# Patient Record
Sex: Female | Born: 1935 | Race: White | Hispanic: No | Marital: Married | Smoking: Never smoker
Health system: Southern US, Community
[De-identification: ages and names within clinical notes are randomized; demographics above are authoritative.]

## PROBLEM LIST (undated history)

## (undated) DIAGNOSIS — G309 Alzheimer's disease, unspecified: Secondary | ICD-10-CM

## (undated) DIAGNOSIS — F419 Anxiety disorder, unspecified: Secondary | ICD-10-CM

## (undated) DIAGNOSIS — R42 Dizziness and giddiness: Secondary | ICD-10-CM

## (undated) DIAGNOSIS — F0280 Dementia in other diseases classified elsewhere without behavioral disturbance: Secondary | ICD-10-CM

## (undated) DIAGNOSIS — R000 Tachycardia, unspecified: Secondary | ICD-10-CM

---

## 2005-05-11 ENCOUNTER — Encounter: Admission: RE | Admit: 2005-05-11 | Discharge: 2005-05-11 | Payer: Self-pay | Admitting: Geriatric Medicine

## 2006-12-18 ENCOUNTER — Other Ambulatory Visit: Admission: RE | Admit: 2006-12-18 | Discharge: 2006-12-18 | Payer: Self-pay | Admitting: Geriatric Medicine

## 2016-05-30 ENCOUNTER — Other Ambulatory Visit: Payer: Self-pay | Admitting: Geriatric Medicine

## 2016-05-30 DIAGNOSIS — R4701 Aphasia: Secondary | ICD-10-CM

## 2016-06-06 ENCOUNTER — Ambulatory Visit
Admission: RE | Admit: 2016-06-06 | Discharge: 2016-06-06 | Disposition: A | Discharge status: Home / Self Care | Payer: Medicare Other | Source: Ambulatory Visit | Attending: Geriatric Medicine | Admitting: Geriatric Medicine

## 2016-06-06 DIAGNOSIS — R4701 Aphasia: Secondary | ICD-10-CM

## 2017-04-10 ENCOUNTER — Emergency Department (HOSPITAL_COMMUNITY)
Admission: EM | Admit: 2017-04-10 | Discharge: 2017-04-10 | Disposition: A | Discharge status: Home / Self Care | Payer: Medicare Other | Attending: Physician Assistant | Admitting: Physician Assistant

## 2017-04-10 ENCOUNTER — Encounter (HOSPITAL_COMMUNITY): Payer: Self-pay | Admitting: *Deleted

## 2017-04-10 DIAGNOSIS — F0151 Vascular dementia with behavioral disturbance: Secondary | ICD-10-CM | POA: Diagnosis not present

## 2017-04-10 DIAGNOSIS — R4182 Altered mental status, unspecified: Secondary | ICD-10-CM | POA: Diagnosis present

## 2017-04-10 DIAGNOSIS — F01518 Vascular dementia, unspecified severity, with other behavioral disturbance: Secondary | ICD-10-CM

## 2017-04-10 HISTORY — DX: Dizziness and giddiness: R42

## 2017-04-10 HISTORY — DX: Alzheimer's disease, unspecified: G30.9

## 2017-04-10 HISTORY — DX: Dementia in other diseases classified elsewhere, unspecified severity, without behavioral disturbance, psychotic disturbance, mood disturbance, and anxiety: F02.80

## 2017-04-10 LAB — CBC WITH DIFFERENTIAL/PLATELET
BASOS PCT: 0 %
Basophils Absolute: 0 10*3/uL (ref 0.0–0.1)
EOS ABS: 0 10*3/uL (ref 0.0–0.7)
Eosinophils Relative: 0 %
HEMATOCRIT: 41.6 % (ref 36.0–46.0)
HEMOGLOBIN: 14.1 g/dL (ref 12.0–15.0)
LYMPHS ABS: 1.4 10*3/uL (ref 0.7–4.0)
Lymphocytes Relative: 27 %
MCH: 32.3 pg (ref 26.0–34.0)
MCHC: 33.9 g/dL (ref 30.0–36.0)
MCV: 95.4 fL (ref 78.0–100.0)
Monocytes Absolute: 0.3 10*3/uL (ref 0.1–1.0)
Monocytes Relative: 5 %
NEUTROS ABS: 3.4 10*3/uL (ref 1.7–7.7)
NEUTROS PCT: 68 %
Platelets: 311 10*3/uL (ref 150–400)
RBC: 4.36 MIL/uL (ref 3.87–5.11)
RDW: 13.5 % (ref 11.5–15.5)
WBC: 5 10*3/uL (ref 4.0–10.5)

## 2017-04-10 LAB — COMPREHENSIVE METABOLIC PANEL
ALT: 16 U/L (ref 14–54)
ANION GAP: 8 (ref 5–15)
AST: 24 U/L (ref 15–41)
Albumin: 4.3 g/dL (ref 3.5–5.0)
Alkaline Phosphatase: 35 U/L — ABNORMAL LOW (ref 38–126)
BUN: 14 mg/dL (ref 6–20)
CALCIUM: 9.1 mg/dL (ref 8.9–10.3)
CHLORIDE: 105 mmol/L (ref 101–111)
CO2: 27 mmol/L (ref 22–32)
CREATININE: 0.59 mg/dL (ref 0.44–1.00)
Glucose, Bld: 92 mg/dL (ref 65–99)
Potassium: 3.5 mmol/L (ref 3.5–5.1)
SODIUM: 140 mmol/L (ref 135–145)
Total Bilirubin: 0.8 mg/dL (ref 0.3–1.2)
Total Protein: 6.8 g/dL (ref 6.5–8.1)

## 2017-04-10 NOTE — ED Notes (Signed)
Pt not cooperative at triage for blood work, discussed with family and will wait until patient is in exam room for further orders.

## 2017-04-10 NOTE — ED Triage Notes (Signed)
Pt brought here by family. Reports hx of alzheimers that is getting progressively worse. Today, pt had episode of becoming paranoid and running away, stating that people are trying to kill her. Family was unable to calm her down and has brought pt here for further eval and guidance. Pt is anxious and paranoid at triage but is cooperating with staff.

## 2017-04-10 NOTE — ED Provider Notes (Signed)
[facility]-EMERGENCY DEPT Provider Note   CSN: [id number] Arrival date & time: 04/10/17  1200     History   Chief Complaint Chief Complaint  Patient presents with  . Altered Mental Status    HPI Sydney Welch is a 82 y.o. female.  HPI   82 year old female with hx of Alzheimer's disease brought here by family members (daughter and son in law) for Progressive worsening dementia. Per family member who is at bedside, for the past 3 months patient has had a steady decline of her mentation. She has episodes of confusion which become more paranoid and tried to run away. Today was the last episode that also daughter to be very concerned. Family did reach out to her primary care provider for her dementia and patient has been tried on 2 different types of medication with poor result. Therefore, patient has not been on any medication for her dementia recently. They did reach out to her PCP who recommend, to the ER for further evaluation. Family members also trying to set up either home health care or the nursing facility without any luck at this time. Family notes the patient has been eating less and sleeping less. No recent medication changes, perhaps urinate a bit less but nothing out of the ordinary. Patient has not been complaining of any pain.  Level V caveats for dementia    Past Medical History:  Diagnosis Date  . Alzheimer disease   . Vertigo     There are no active problems to display for this patient.   History reviewed. No pertinent surgical history.  OB History    No data available       Home Medications    Prior to Admission medications   Not on File    Family History History reviewed. No pertinent family history.  Social History Social History  Substance Use Topics  . Smoking status: Never Smoker  . Smokeless tobacco: Not on file  . Alcohol use No     Allergies   Caffeine   Review of Systems Review of Systems  Unable to perform ROS: Dementia      Physical Exam Updated Vital Signs BP (!) 145/100 (BP Location: Right Arm)   Pulse (!) 59   Temp 98 F (36.7 C) (Oral)   Resp (!) 22   SpO2 98%   Physical Exam  Constitutional: She appears well-developed and well-nourished. No distress.  Pleasant elderly female, laying in bed in no acute discomfort, nontoxic in appearance  HENT:  Head: Atraumatic.  Mouth/Throat: Oropharynx is clear and moist.  Eyes: Pupils are equal, round, and reactive to light. Conjunctivae and EOM are normal.  Neck: Neck supple.  No nuchal rigidity  Cardiovascular: Normal rate and regular rhythm.   Pulmonary/Chest: Effort normal and breath sounds normal.  Abdominal: Soft. Bowel sounds are normal. She exhibits no distension. There is no tenderness.  Neurological: She is alert. No cranial nerve deficit or sensory deficit. GCS eye subscore is 4. GCS verbal subscore is 5. GCS motor subscore is 6.  Alert to self only.  Skin: No rash noted.  Psychiatric: She has a normal mood and affect.  Pleasant, and alert to self only.  Nursing note and vitals reviewed.    ED Treatments / Results  Labs (all labs ordered are listed, but only abnormal results are displayed) Labs Reviewed  COMPREHENSIVE METABOLIC PANEL - Abnormal; Notable for the following:       Result Value   Alkaline Phosphatase 35 (*)  All other components within normal limits  CBC WITH DIFFERENTIAL/PLATELET    EKG  EKG Interpretation None       Radiology No results found.  Procedures Procedures (including critical care time)  Medications Ordered in ED Medications - No data to display   Initial Impression / Assessment and Plan / ED Course  I have reviewed the triage vital signs and the nursing notes.  Pertinent labs & imaging results that were available during my care of the patient were reviewed by me and considered in my medical decision making (see chart for details).     BP (!) 145/100 (BP Location: Right Arm)   Pulse  (!) 59   Temp 98 F (36.7 C) (Oral)   Resp (!) 22   SpO2 98%    Final Clinical Impressions(s) / ED Diagnoses   Final diagnoses:  Vascular dementia with behavior disturbance    New Prescriptions New Prescriptions   No medications on file   4:06 PM Patient with progressive worsening dementia not on a medication.  It appears that her symptoms progressive nature without any inciting factors. Plan to screen with basic labs, and check UA. Will discuss with social worker and case management to see if this additional social support  6:21 PM Appreciate consultation from Case Manager who have discussed with family members and offer outpatient support.  Pt will need face-to-face homehealth needs due to increase confusion and fall risk.    7:05 PM Labs are reassuring.  Family members appreciate Case Management help.  They feel comfortable with the plan.  Pt to be discharge.  Care discussed with Dr. Corlis LeakMacKuen who agrees with plan.    Fayrene Helperran, Amandajo Gonder, PA-C 04/10/17 1906    Abelino DerrickMackuen, Courteney Lyn, MD 04/10/17 [phone]

## 2017-04-10 NOTE — Care Management Note (Signed)
Case Management Note  Patient Details  Name: Sydney Welch MRN: [id number] Date of Birth: 1935/05/12  Subjective/Objective:        Patient presented to [facility] ED with   AMS high risk for falls.          Action/Plan: ED CM consulted concerning HH recommendations. Family has reported patient is not taking medications appropriate and an  Unsteady gait. Patient may benefit from [facility] services. CM met with patient and family at bedside to discuss recommendations for [facility] services at bedside. Patient and family are agreeable. Offered choice [facility] selected CM verified contact information. Referral faxed via [facility], no further questions or concerns at this time. Explained 24-48 hours nurse will contact patient and family by phone, patient and family verbalized understanding.       Expected Discharge Date:   04/10/17               Expected Discharge Plan:  [place]  In-House Referral:     Discharge planning Services  CM Consult  Post Acute Care Choice:    Choice offered to:  Patient, Adult Children  DME Arranged:    DME Agency:     HH Arranged:  RN, PT, OT, Nurse's Aide, Social Work [employer] Agency:  [facility]  Status of Service:  Completed, signed off  If discussed at [employer], dates discussed:    Additional CommentsLaurena Slimmer, RN 04/10/2017, 10:48 PM

## 2017-06-16 ENCOUNTER — Emergency Department (HOSPITAL_COMMUNITY): Payer: Medicare Other

## 2017-06-16 ENCOUNTER — Inpatient Hospital Stay (HOSPITAL_COMMUNITY): Payer: Medicare Other

## 2017-06-16 ENCOUNTER — Encounter (HOSPITAL_COMMUNITY): Payer: Self-pay

## 2017-06-16 ENCOUNTER — Inpatient Hospital Stay (HOSPITAL_COMMUNITY)
Admission: EM | Admit: 2017-06-16 | Discharge: 2017-06-22 | DRG: 470 | Disposition: A | Discharge status: Skilled Nursing Facility | Payer: Medicare Other | Attending: Internal Medicine | Admitting: Internal Medicine

## 2017-06-16 DIAGNOSIS — Z515 Encounter for palliative care: Secondary | ICD-10-CM

## 2017-06-16 DIAGNOSIS — F419 Anxiety disorder, unspecified: Secondary | ICD-10-CM | POA: Diagnosis present

## 2017-06-16 DIAGNOSIS — R10817 Generalized abdominal tenderness: Secondary | ICD-10-CM | POA: Diagnosis not present

## 2017-06-16 DIAGNOSIS — K5900 Constipation, unspecified: Secondary | ICD-10-CM | POA: Diagnosis present

## 2017-06-16 DIAGNOSIS — R509 Fever, unspecified: Secondary | ICD-10-CM | POA: Diagnosis not present

## 2017-06-16 DIAGNOSIS — R1310 Dysphagia, unspecified: Secondary | ICD-10-CM | POA: Diagnosis not present

## 2017-06-16 DIAGNOSIS — Z66 Do not resuscitate: Secondary | ICD-10-CM | POA: Diagnosis not present

## 2017-06-16 DIAGNOSIS — F0281 Dementia in other diseases classified elsewhere with behavioral disturbance: Secondary | ICD-10-CM | POA: Diagnosis present

## 2017-06-16 DIAGNOSIS — M8580 Other specified disorders of bone density and structure, unspecified site: Secondary | ICD-10-CM | POA: Diagnosis present

## 2017-06-16 DIAGNOSIS — S72011A Unspecified intracapsular fracture of right femur, initial encounter for closed fracture: Secondary | ICD-10-CM | POA: Diagnosis present

## 2017-06-16 DIAGNOSIS — G309 Alzheimer's disease, unspecified: Secondary | ICD-10-CM | POA: Diagnosis present

## 2017-06-16 DIAGNOSIS — S72009A Fracture of unspecified part of neck of unspecified femur, initial encounter for closed fracture: Secondary | ICD-10-CM | POA: Diagnosis present

## 2017-06-16 DIAGNOSIS — Z7189 Other specified counseling: Secondary | ICD-10-CM

## 2017-06-16 DIAGNOSIS — F03918 Unspecified dementia, unspecified severity, with other behavioral disturbance: Secondary | ICD-10-CM | POA: Diagnosis present

## 2017-06-16 DIAGNOSIS — W19XXXA Unspecified fall, initial encounter: Secondary | ICD-10-CM | POA: Diagnosis present

## 2017-06-16 DIAGNOSIS — F0391 Unspecified dementia with behavioral disturbance: Secondary | ICD-10-CM | POA: Diagnosis present

## 2017-06-16 DIAGNOSIS — R52 Pain, unspecified: Secondary | ICD-10-CM

## 2017-06-16 DIAGNOSIS — R319 Hematuria, unspecified: Secondary | ICD-10-CM | POA: Diagnosis present

## 2017-06-16 DIAGNOSIS — E876 Hypokalemia: Secondary | ICD-10-CM | POA: Diagnosis not present

## 2017-06-16 DIAGNOSIS — D649 Anemia, unspecified: Secondary | ICD-10-CM | POA: Diagnosis present

## 2017-06-16 DIAGNOSIS — Z888 Allergy status to other drugs, medicaments and biological substances status: Secondary | ICD-10-CM

## 2017-06-16 DIAGNOSIS — E860 Dehydration: Secondary | ICD-10-CM | POA: Diagnosis present

## 2017-06-16 DIAGNOSIS — R000 Tachycardia, unspecified: Secondary | ICD-10-CM | POA: Diagnosis present

## 2017-06-16 DIAGNOSIS — S72001A Fracture of unspecified part of neck of right femur, initial encounter for closed fracture: Secondary | ICD-10-CM

## 2017-06-16 DIAGNOSIS — R109 Unspecified abdominal pain: Secondary | ICD-10-CM

## 2017-06-16 HISTORY — DX: Tachycardia, unspecified: R00.0

## 2017-06-16 HISTORY — DX: Anxiety disorder, unspecified: F41.9

## 2017-06-16 LAB — URINALYSIS, ROUTINE W REFLEX MICROSCOPIC
Bacteria, UA: NONE SEEN
Glucose, UA: NEGATIVE mg/dL
Hgb urine dipstick: NEGATIVE
KETONES UR: 20 mg/dL — AB
Leukocytes, UA: NEGATIVE
NITRITE: NEGATIVE
PH: 5 (ref 5.0–8.0)
Protein, ur: 30 mg/dL — AB
SPECIFIC GRAVITY, URINE: 1.03 (ref 1.005–1.030)
WBC, UA: NONE SEEN WBC/hpf (ref 0–5)

## 2017-06-16 LAB — CBC WITH DIFFERENTIAL/PLATELET
BASOS PCT: 0 %
Basophils Absolute: 0 10*3/uL (ref 0.0–0.1)
Eosinophils Absolute: 0 10*3/uL (ref 0.0–0.7)
Eosinophils Relative: 0 %
HEMATOCRIT: 48.1 % — AB (ref 36.0–46.0)
Hemoglobin: 16.3 g/dL — ABNORMAL HIGH (ref 12.0–15.0)
LYMPHS PCT: 20 %
Lymphs Abs: 1.3 10*3/uL (ref 0.7–4.0)
MCH: 31.5 pg (ref 26.0–34.0)
MCHC: 33.9 g/dL (ref 30.0–36.0)
MCV: 93 fL (ref 78.0–100.0)
MONO ABS: 0.6 10*3/uL (ref 0.1–1.0)
MONOS PCT: 9 %
NEUTROS ABS: 4.8 10*3/uL (ref 1.7–7.7)
Neutrophils Relative %: 71 %
Platelets: 400 10*3/uL (ref 150–400)
RBC: 5.17 MIL/uL — ABNORMAL HIGH (ref 3.87–5.11)
RDW: 12.8 % (ref 11.5–15.5)
WBC: 6.7 10*3/uL (ref 4.0–10.5)

## 2017-06-16 LAB — COMPREHENSIVE METABOLIC PANEL
ALBUMIN: 4.4 g/dL (ref 3.5–5.0)
ALK PHOS: 63 U/L (ref 38–126)
ALT: 16 U/L (ref 14–54)
ANION GAP: 13 (ref 5–15)
AST: 27 U/L (ref 15–41)
BUN: 14 mg/dL (ref 6–20)
CALCIUM: 9.7 mg/dL (ref 8.9–10.3)
CO2: 26 mmol/L (ref 22–32)
Chloride: 101 mmol/L (ref 101–111)
Creatinine, Ser: 0.56 mg/dL (ref 0.44–1.00)
GFR calc Af Amer: >60 mL/min (ref >60)
GFR calc non Af Amer: >60 mL/min (ref >60)
GLUCOSE: 91 mg/dL (ref 65–99)
Potassium: 4.1 mmol/L (ref 3.5–5.1)
SODIUM: 140 mmol/L (ref 135–145)
Total Bilirubin: 1.1 mg/dL (ref 0.3–1.2)
Total Protein: 7.6 g/dL (ref 6.5–8.1)

## 2017-06-16 LAB — TROPONIN I: Troponin I: <0.03 ng/mL (ref <0.03)

## 2017-06-16 LAB — I-STAT CG4 LACTIC ACID, ED: Lactic Acid, Venous: 1.01 mmol/L (ref 0.5–1.9)

## 2017-06-16 MED ORDER — VERAPAMIL HCL ER 240 MG PO TBCR
240.0000 mg | EXTENDED_RELEASE_TABLET | Freq: Every day | ORAL | Status: DC
  Administered 2017-06-17: 240 mg via ORAL
  Filled 2017-06-16 (×7): qty 1

## 2017-06-16 MED ORDER — SORBITOL 70 % SOLN
960.0000 mL | TOPICAL_OIL | Freq: Once | ORAL | Status: AC
  Administered 2017-06-16: 960 mL via RECTAL
  Filled 2017-06-16: qty 240

## 2017-06-16 MED ORDER — METHOCARBAMOL 500 MG PO TABS
500.0000 mg | ORAL_TABLET | Freq: Four times a day (QID) | ORAL | Status: DC | PRN
Start: 2017-06-16 — End: 2017-06-22
  Administered 2017-06-19: 500 mg via ORAL
  Filled 2017-06-16: qty 1

## 2017-06-16 MED ORDER — SODIUM CHLORIDE 0.9 % IV BOLUS (SEPSIS)
1000.0000 mL | Freq: Once | INTRAVENOUS | Status: AC
  Administered 2017-06-16: 1000 mL via INTRAVENOUS

## 2017-06-16 MED ORDER — HYDROCODONE-ACETAMINOPHEN 5-325 MG PO TABS
1.0000 | ORAL_TABLET | Freq: Four times a day (QID) | ORAL | Status: DC | PRN
  Administered 2017-06-17: 2 via ORAL
  Filled 2017-06-16: qty 2

## 2017-06-16 MED ORDER — LORAZEPAM 2 MG/ML IJ SOLN
1.0000 mg | Freq: Four times a day (QID) | INTRAMUSCULAR | Status: DC | PRN
  Administered 2017-06-17 – 2017-06-22 (×10): 1 mg via INTRAVENOUS
  Filled 2017-06-16 (×10): qty 1

## 2017-06-16 MED ORDER — LORAZEPAM 2 MG/ML IJ SOLN
0.5000 mg | Freq: Once | INTRAMUSCULAR | Status: AC
  Administered 2017-06-16: 0.5 mg via INTRAVENOUS
  Filled 2017-06-16: qty 1

## 2017-06-16 MED ORDER — METHOCARBAMOL 1000 MG/10ML IJ SOLN
500.0000 mg | Freq: Four times a day (QID) | INTRAVENOUS | Status: DC | PRN
  Filled 2017-06-16: qty 5

## 2017-06-16 MED ORDER — LACTATED RINGERS IV SOLN
INTRAVENOUS | Status: DC
  Administered 2017-06-17 (×2): via INTRAVENOUS

## 2017-06-16 MED ORDER — SODIUM CHLORIDE 0.9 % IV BOLUS (SEPSIS)
500.0000 mL | Freq: Once | INTRAVENOUS | Status: AC
  Administered 2017-06-16: 500 mL via INTRAVENOUS

## 2017-06-16 MED ORDER — MORPHINE SULFATE (PF) 2 MG/ML IV SOLN: 0.5000 mg | INTRAVENOUS | Status: DC | PRN

## 2017-06-16 NOTE — ED Provider Notes (Signed)
[facility]-EMERGENCY DEPT Provider Note   CSN: [id number] Arrival date & time: 06/16/17  1605     History   Chief Complaint Chief Complaint  Patient presents with  . Weakness    HPI Sydney Welch is a 82 y.o. female.  Pt has a hx of alzheimer's disease and is cared for by her daughter.  The daughter said pt has been sleeping more than normal and has not been acting her normal self.  Daughter said she has not been eating.  Drinking little.  Decreased uop.  No bm in about 2 weeks.  Pt unable to given any hx.  Pt c/o some right and mid lower abdominal pain.      Past Medical History:  Diagnosis Date  . Alzheimer disease   . Vertigo     There are no active problems to display for this patient.   No past surgical history on file.  OB History    No data available       Home Medications    Prior to Admission medications   Medication Sig Start Date End Date Taking? Authorizing Provider  ENSURE (ENSURE) Take 237 mLs by mouth daily.   Yes [provider]  LORazepam (ATIVAN) 1 MG tablet Take 1 mg by mouth 2 (two) times daily. 06/02/17  Yes [provider]  verapamil (CALAN-SR) 240 MG CR tablet Take 240 mg by mouth daily. 03/26/17  Yes [provider]    Family History No family history on file.  Social History Social History  Substance Use Topics  . Smoking status: Never Smoker  . Smokeless tobacco: Not on file  . Alcohol use No     Allergies   Caffeine   Review of Systems Review of Systems  Unable to perform ROS: Dementia     Physical Exam Updated Vital Signs BP 130/84   Pulse 85   Temp 97.9 F (36.6 C) (Oral)   Resp 17   SpO2 100%   Physical Exam  Constitutional: She appears well-developed and well-nourished.  HENT:  Head: Normocephalic and atraumatic.  Right Ear: External ear normal.  Left Ear: External ear normal.  Nose: Nose normal.  Mouth/Throat: Mucous membranes are dry.  Eyes: Pupils are equal, round, and  reactive to light. Conjunctivae and EOM are normal.  Neck: Normal range of motion. Neck supple.  Cardiovascular: Regular rhythm, normal heart sounds and intact distal pulses.  Tachycardia present.   Pulmonary/Chest: Effort normal and breath sounds normal.  Abdominal: Soft. Bowel sounds are normal.  Musculoskeletal: Normal range of motion.  Neurological: She is alert.  Skin: Skin is warm and dry.  Nursing note and vitals reviewed.    ED Treatments / Results  Labs (all labs ordered are listed, but only abnormal results are displayed) Labs Reviewed  URINALYSIS, ROUTINE W REFLEX MICROSCOPIC - Abnormal; Notable for the following:       Result Value   Color, Urine AMBER (*)    APPearance CLOUDY (*)    Bilirubin Urine SMALL (*)    Ketones, ur 20 (*)    Protein, ur 30 (*)    Squamous Epithelial / LPF 0-5 (*)    All other components within normal limits  CBC WITH DIFFERENTIAL/PLATELET - Abnormal; Notable for the following:    RBC 5.17 (*)    Hemoglobin 16.3 (*)    HCT 48.1 (*)    All other components within normal limits  CULTURE, BLOOD (ROUTINE X 2)  CULTURE, BLOOD (ROUTINE X 2)  COMPREHENSIVE  METABOLIC PANEL  TROPONIN I  I-STAT CG4 LACTIC ACID, ED  I-STAT CG4 LACTIC ACID, ED    EKG  EKG Interpretation  Date/Time:  Sunday June 16 2017 16:29:21 EDT Ventricular Rate:  103 PR Interval:    QRS Duration: 93 QT Interval:  336 QTC Calculation: 440 R Axis:   70 Text Interpretation:  Sinus tachycardia Right atrial enlargement Consider anterior infarct Artifact in lead(s) I II aVR aVL Confirmed by Jacalyn Lefevre [phone]) on 06/16/2017 9:20:58 PM       Radiology Dg Chest 2 View  Result Date: 06/16/2017 CLINICAL DATA:  Mental status change. EXAM: CHEST  2 VIEW COMPARISON:  None. FINDINGS: The heart size is normal. The lungs are hyperinflated but clear. No pleural effusion or edema. Thoracic scoliosis identified. IMPRESSION: 1. No acute cardiopulmonary abnormalities. 2.  Scoliosis. Electronically Signed   By: Signa Kell M.D.   On: 06/16/2017 17:57   Dg Abd 2 Views  Result Date: 06/16/2017 CLINICAL DATA:  Abdominal pain. EXAM: ABDOMEN - 2 VIEW COMPARISON:  None. FINDINGS: The bowel gas pattern is normal. Moderate stool burden identified within the colon. There is no evidence of free air. No radio-opaque calculi or other significant radiographic abnormality is seen. IMPRESSION: Nonobstructive bowel gas pattern. Moderate stool burden within the colon. Electronically Signed   By: Signa Kell M.D.   On: 06/16/2017 18:00   Ct Renal Stone Study  Result Date: 06/16/2017 CLINICAL DATA:  Altered mental status with possible UTI. EXAM: CT ABDOMEN AND PELVIS WITHOUT CONTRAST TECHNIQUE: Multidetector CT imaging of the abdomen and pelvis was performed following the standard protocol without IV contrast. COMPARISON:  None. FINDINGS: Lower chest: Minimal dependent atelectasis over the right base. Hepatobiliary: The liver and biliary tree are within normal. Possible sludge versus small stones within the dependent portion of the gallbladder. Pancreas: Pancreas somewhat difficult to visualize on this noncontrast study although no definite abnormality. Spleen: Within normal. Adrenals/Urinary Tract: Adrenal glands are normal. Kidneys are normal in size without hydronephrosis or nephrolithiasis. 1.1 cm cyst over the mid to upper pole left kidney with subcentimeter hyperdensity over the corticomedullary junction of the mid to upper pole left kidney with Hounsfield unit measurements of 99 likely early calcification. Ureters are unremarkable. Tiny focus of air within the nondependent portion of the bladder compatible with known recent catheterization. Stomach/Bowel: The stomach and small bowel are within normal. Appendix is normal. There is diverticulosis of the colon with mild fecal retention throughout the colon. Vascular/Lymphatic: Minimal calcified plaque over the abdominal aorta. No  significant adenopathy. Reproductive: Within normal. Other: No significant free fluid or focal inflammatory change. No free peritoneal air. Musculoskeletal: Moderate degenerate changes spine with curvature of the thoracolumbar spine convex left. Mild compression fracture of L4 age indeterminate. Mild degenerative change of the hips. Subcapital fracture of the right femoral neck likely subacute nature with minimal displacement. IMPRESSION: No acute findings in the abdomen/pelvis. Minimally displaced subcapital fracture of the right femoral neck likely subacute nature. Suggestion of mild gallbladder sludge versus stones. 1.1 cm left renal cyst with suggestion mild left focal nephrocalcinosis. Diverticulosis of the colon. Mild L4 compression fracture likely chronic. These results were called by telephone at the time of interpretation on 06/16/2017 at 8:33 pm to Dr. Jacalyn Lefevre , who verbally acknowledged these results. Electronically Signed   By: Elberta Fortis M.D.   On: 06/16/2017 20:33    Procedures Procedures (including critical care time)  Medications Ordered in ED Medications  sodium chloride 0.9 % bolus 1,000  mL (0 mLs Intravenous Stopped 06/16/17 1842)    And  sodium chloride 0.9 % bolus 500 mL (0 mLs Intravenous Stopped 06/16/17 2054)  sorbitol, milk of mag, mineral oil, glycerin (SMOG) enema (960 mLs Rectal Given 06/16/17 2016)  LORazepam (ATIVAN) injection 0.5 mg (0.5 mg Intravenous Given 06/16/17 1848)  sodium chloride 0.9 % bolus 1,000 mL (0 mLs Intravenous Stopped 06/16/17 2015)     Initial Impression / Assessment and Plan / ED Course  I have reviewed the triage vital signs and the nursing notes.  Pertinent labs & imaging results that were available during my care of the patient were reviewed by me and considered in my medical decision making (see chart for details).    I did the CT b/c of the hematuria and possibility of kidney stone.  It did not show that, but she does have a hip  fracture.  The pt's family said she fell about 1 month ago.  They took her to her doctor's and she was able to walk with assistance, so no xray was done.  Daughters said she is no longer able to walk.  They thought it was due to an infection or some other cause.  Pt d/w Dr. Charlann Boxer (ortho) who will see her tomorrow here at [facility].  Pt also d/w Dr. Ophelia Charter (triad) for admission.  Final Clinical Impressions(s) / ED Diagnoses   Final diagnoses:  Pain  Closed fracture of neck of right femur, initial encounter (HCC)  Dehydration  Hematuria, unspecified type  Constipation, unspecified constipation type    New Prescriptions New Prescriptions   No medications on file     Jacalyn Lefevre, MD 06/16/17 2143

## 2017-06-16 NOTE — ED Triage Notes (Signed)
Her daughter states that for past few days pt. Is eating less and is becoming "sleepy a lot". Pt. Arrives here alert and confused, which is her baseline.

## 2017-06-16 NOTE — ED Notes (Signed)
Bed: ZO10 Expected date: 06/16/17 Expected time: 3:54 PM Means of arrival: Ambulance Comments: AMS ? UTI

## 2017-06-16 NOTE — H&P (Signed)
History and Physical    Sydney Welch ZOX:[id number] DOB: Apr 08, 1935 DOA: 06/16/2017  PCP: Merlene Laughter, MD Consultants:  None Patient coming from: Home - lives with daughters, son-in-law, grandson; [place]: daughter, [phone]  Chief Complaint: excessive somnolence  HPI: Sydney Welch is a 82 y.o. female with medical history significant of tachycardia and Alzheimer's with behavioral disturbance presenting with excessive somnolence. She fell about 4 times in the last month.  Today, she was sleeping 18 hours and it was hard to get her awake.  She has been sleeping more than usual over the last 3-4 days.  She has 2 skin cancers and she was supposed to go for consultation tomorrow to have them removed.  She has been having pain - moaning and groaning with being moved.  Over the last week or two she hasn't been able to walk at all - which they thought was due to progressive dementia.  Dr. Pete Glatter evaluated her about 2 1/2 weeks ago and watched her walk and wasn't concerned at that time.  Daughter thinks the fracture may have happened a month ago but she has been able to walk since then.  She has not been able to swallow her Ativan and Verapamil the last couple of days.   ED Course: Found to have hematuria, concern for kidney stone so CT done and showed right hip fracture.  Dr. Charlann Boxer to see tomorrow and will plan to do ORIF.  Review of Systems: Unable to perform  Ambulatory Status:  Ambulated without assistance prior to the last 2 weeks  Past Medical History:  Diagnosis Date  . Alzheimer disease   . Anxiety   . Tachycardia   . Vertigo     History reviewed. No pertinent surgical history.  Social History   Social History  . Marital status: Married    Spouse name: N/A  . Number of children: N/A  . Years of education: N/A   Occupational History  . retired    Social History Main Topics  . Smoking status: Never Smoker  . Smokeless tobacco: Never Used  . Alcohol use No  .  Drug use: No  . Sexual activity: Not on file   Other Topics Concern  . Not on file   Social History Narrative  . No narrative on file    Allergies  Allergen Reactions  . Caffeine     History reviewed. No pertinent family history.  Prior to Admission medications   Medication Sig Start Date End Date Taking? Authorizing Provider  ENSURE (ENSURE) Take 237 mLs by mouth daily.   Yes [provider]  LORazepam (ATIVAN) 1 MG tablet Take 1 mg by mouth 2 (two) times daily. 06/02/17  Yes [provider]  verapamil (CALAN-SR) 240 MG CR tablet Take 240 mg by mouth daily. 03/26/17  Yes [provider]    Physical Exam: Vitals:   06/16/17 1619 06/16/17 1900 06/16/17 2018 06/16/17 2030  BP: 91/79 121/69 130/84 (!) 146/98  Pulse: (!) 104 92 85   Resp: 18  17   Temp: 97.9 F (36.6 C)     TempSrc: Oral     SpO2: 100% 99% 100%      General:  Appears older than stated age, mumbles nonsensically frequently, agitated and wants to hold hands constantly Eyes:  PERRL, EOMI, normal lids, iris ENT: normal lips & tongue Neck:  no LAD, masses or thyromegaly; no carotid bruits Cardiovascular:  RRR, no m/r/g. No LE edema.  Respiratory:   CTA bilaterally  with no wheezes/rales/rhonchi.  Normal respiratory effort. Abdomen:  soft, NT, ND, NABS Skin:  no rash or induration seen on limited exam Musculoskeletal:  grossly normal tone BUE/BLE, good ROM, no bony abnormality Psychiatric: agitated, frequent nonsensical speech, AOx1 Neurologic: unable to assess    Radiological Exams on Admission: Dg Chest 2 View  Result Date: 06/16/2017 CLINICAL DATA:  Mental status change. EXAM: CHEST  2 VIEW COMPARISON:  None. FINDINGS: The heart size is normal. The lungs are hyperinflated but clear. No pleural effusion or edema. Thoracic scoliosis identified. IMPRESSION: 1. No acute cardiopulmonary abnormalities. 2. Scoliosis. Electronically Signed   By: Signa Kell M.D.   On: 06/16/2017 17:57    Dg Abd 2 Views  Result Date: 06/16/2017 CLINICAL DATA:  Abdominal pain. EXAM: ABDOMEN - 2 VIEW COMPARISON:  None. FINDINGS: The bowel gas pattern is normal. Moderate stool burden identified within the colon. There is no evidence of free air. No radio-opaque calculi or other significant radiographic abnormality is seen. IMPRESSION: Nonobstructive bowel gas pattern. Moderate stool burden within the colon. Electronically Signed   By: Signa Kell M.D.   On: 06/16/2017 18:00   Ct Renal Stone Study  Result Date: 06/16/2017 CLINICAL DATA:  Altered mental status with possible UTI. EXAM: CT ABDOMEN AND PELVIS WITHOUT CONTRAST TECHNIQUE: Multidetector CT imaging of the abdomen and pelvis was performed following the standard protocol without IV contrast. COMPARISON:  None. FINDINGS: Lower chest: Minimal dependent atelectasis over the right base. Hepatobiliary: The liver and biliary tree are within normal. Possible sludge versus small stones within the dependent portion of the gallbladder. Pancreas: Pancreas somewhat difficult to visualize on this noncontrast study although no definite abnormality. Spleen: Within normal. Adrenals/Urinary Tract: Adrenal glands are normal. Kidneys are normal in size without hydronephrosis or nephrolithiasis. 1.1 cm cyst over the mid to upper pole left kidney with subcentimeter hyperdensity over the corticomedullary junction of the mid to upper pole left kidney with Hounsfield unit measurements of 99 likely early calcification. Ureters are unremarkable. Tiny focus of air within the nondependent portion of the bladder compatible with known recent catheterization. Stomach/Bowel: The stomach and small bowel are within normal. Appendix is normal. There is diverticulosis of the colon with mild fecal retention throughout the colon. Vascular/Lymphatic: Minimal calcified plaque over the abdominal aorta. No significant adenopathy. Reproductive: Within normal. Other: No significant free fluid  or focal inflammatory change. No free peritoneal air. Musculoskeletal: Moderate degenerate changes spine with curvature of the thoracolumbar spine convex left. Mild compression fracture of L4 age indeterminate. Mild degenerative change of the hips. Subcapital fracture of the right femoral neck likely subacute nature with minimal displacement. IMPRESSION: No acute findings in the abdomen/pelvis. Minimally displaced subcapital fracture of the right femoral neck likely subacute nature. Suggestion of mild gallbladder sludge versus stones. 1.1 cm left renal cyst with suggestion mild left focal nephrocalcinosis. Diverticulosis of the colon. Mild L4 compression fracture likely chronic. These results were called by telephone at the time of interpretation on 06/16/2017 at 8:33 pm to Dr. Jacalyn Lefevre , who verbally acknowledged these results. Electronically Signed   By: Elberta Fortis M.D.   On: 06/16/2017 20:33    EKG: Independently reviewed.  Sinus tachycardia with rate 103; nonspecific ST changes with no evidence of acute ischemia, +artifact   Labs on Admission: I have personally reviewed the available labs and imaging studies at the time of the admission.  Pertinent labs:   UA: 20 ketones, 30 protein, TNTC RBC, small bilirubin Blood cultures pending  Assessment/Plan  Principal Problem:   Hip fracture (HCC) Active Problems:   Dementia with behavioral disturbance   Tachycardia   Hip fracture -Mechanical falls resulting in hip fracture, uncertain timeframe of injury -Orthopedics consult in AM -NPO after midnight in anticipation of surgical repair tomorrow -SCDs overnight, start Lovenox post-operatively (or as per ortho) -CXR and EKG done in ER -Pain control with Vicodin, morphine -SW consult for rehab placement -Will need PT consult post-operatively -Hip fracture order set utilized  Dementia -She is not taking medications for dementia, may benefit from initiation of Aricept -Family counseled  about how patients with dementia often do better behaviorally while hospitalized if family members are present overnight -Will order Recruitment consultant -She does well with Ativan at home for her behavior disturbance; will try to use IV Ativan here but may need to transition to Haldol  Tachycardia -Continue nightly verapamil -May need apple sauce or another way to get way to take the medication as she has had some oral aversion this week  DVT prophylaxis: SCDs Code Status:  DNR - confirmed with family Family Communication: Daughters, son-in-law, another family member present throughout evaluation  Disposition Plan: SNF once clinically improved Consults called: Orthopedics; Nutrition, SW, PT, OT Admission status: Admit - It is my clinical opinion that admission to INPATIENT is reasonable and necessary because this patient will require at least 2 midnights in the hospital to treat this condition based on the medical complexity of the problems presented.  Given the aforementioned information, the predictability of an adverse outcome is felt to be significant.    Jonah Blue MD [facility]  If note is complete, please contact covering daytime or nighttime physician. [email or web address] Password [email or web address]  06/16/2017, 10:46 PM

## 2017-06-16 NOTE — ED Notes (Signed)
admitting Provider at bedside. 

## 2017-06-16 NOTE — ED Notes (Signed)
ED Provider at bedside to discuss with family about plan of care and disposition

## 2017-06-17 ENCOUNTER — Encounter (HOSPITAL_COMMUNITY): Payer: Self-pay | Admitting: Certified Registered Nurse Anesthetist

## 2017-06-17 ENCOUNTER — Inpatient Hospital Stay (HOSPITAL_COMMUNITY): Payer: Medicare Other

## 2017-06-17 ENCOUNTER — Encounter (HOSPITAL_COMMUNITY)
Admission: EM | Disposition: A | Discharge status: Skilled Nursing Facility | Payer: Self-pay | Source: Home / Self Care | Attending: Internal Medicine

## 2017-06-17 ENCOUNTER — Inpatient Hospital Stay (HOSPITAL_COMMUNITY): Payer: Medicare Other | Admitting: Anesthesiology

## 2017-06-17 DIAGNOSIS — S72009A Fracture of unspecified part of neck of unspecified femur, initial encounter for closed fracture: Secondary | ICD-10-CM

## 2017-06-17 HISTORY — PX: HIP ARTHROPLASTY: SHX981

## 2017-06-17 LAB — SURGICAL PCR SCREEN
MRSA, PCR: POSITIVE — AB
STAPHYLOCOCCUS AUREUS: POSITIVE — AB

## 2017-06-17 SURGERY — HEMIARTHROPLASTY, HIP, DIRECT ANTERIOR APPROACH, FOR FRACTURE
Anesthesia: Spinal | Laterality: Right

## 2017-06-17 MED ORDER — DOCUSATE SODIUM 100 MG PO CAPS
100.0000 mg | ORAL_CAPSULE | Freq: Two times a day (BID) | ORAL | Status: DC
  Administered 2017-06-19: 100 mg via ORAL
  Filled 2017-06-17 (×5): qty 1

## 2017-06-17 MED ORDER — MUPIROCIN 2 % EX OINT
1.0000 "application " | TOPICAL_OINTMENT | Freq: Two times a day (BID) | CUTANEOUS | Status: AC
  Administered 2017-06-18 – 2017-06-22 (×10): 1 via NASAL
  Filled 2017-06-17 (×2): qty 22

## 2017-06-17 MED ORDER — POLYETHYLENE GLYCOL 3350 17 G PO PACK: 17.0000 g | PACK | Freq: Every day | ORAL | Status: DC | PRN

## 2017-06-17 MED ORDER — PROPOFOL 10 MG/ML IV BOLUS
INTRAVENOUS | Status: AC
  Filled 2017-06-17: qty 20

## 2017-06-17 MED ORDER — EPHEDRINE SULFATE 50 MG/ML IJ SOLN
INTRAMUSCULAR | Status: DC | PRN
  Administered 2017-06-17 (×2): 10 mg via INTRAVENOUS
  Administered 2017-06-17 (×2): 15 mg via INTRAVENOUS

## 2017-06-17 MED ORDER — ALBUMIN HUMAN 5 % IV SOLN
INTRAVENOUS | Status: AC
  Filled 2017-06-17: qty 250

## 2017-06-17 MED ORDER — MENTHOL 3 MG MT LOZG: 1.0000 | LOZENGE | OROMUCOSAL | Status: DC | PRN

## 2017-06-17 MED ORDER — ENOXAPARIN SODIUM 40 MG/0.4ML ~~LOC~~ SOLN
40.0000 mg | SUBCUTANEOUS | Status: DC
  Administered 2017-06-18 – 2017-06-22 (×4): 40 mg via SUBCUTANEOUS
  Filled 2017-06-17 (×4): qty 0.4

## 2017-06-17 MED ORDER — HYDROCODONE-ACETAMINOPHEN 5-325 MG PO TABS
1.0000 | ORAL_TABLET | Freq: Four times a day (QID) | ORAL | Status: DC | PRN
  Administered 2017-06-19: 1 via ORAL
  Filled 2017-06-17: qty 1

## 2017-06-17 MED ORDER — ONDANSETRON HCL 4 MG/2ML IJ SOLN: 4.0000 mg | Freq: Once | INTRAMUSCULAR | Status: DC | PRN

## 2017-06-17 MED ORDER — CEFAZOLIN SODIUM-DEXTROSE 1-4 GM/50ML-% IV SOLN
1.0000 g | Freq: Four times a day (QID) | INTRAVENOUS | Status: AC
  Administered 2017-06-17 – 2017-06-18 (×2): 1 g via INTRAVENOUS
  Filled 2017-06-17 (×2): qty 50

## 2017-06-17 MED ORDER — FENTANYL CITRATE (PF) 100 MCG/2ML IJ SOLN
INTRAMUSCULAR | Status: AC
  Filled 2017-06-17: qty 2

## 2017-06-17 MED ORDER — PHENYLEPHRINE HCL 10 MG/ML IJ SOLN
INTRAMUSCULAR | Status: DC | PRN
  Administered 2017-06-17: 40 ug/min via INTRAVENOUS

## 2017-06-17 MED ORDER — FENTANYL CITRATE (PF) 100 MCG/2ML IJ SOLN: 25.0000 ug | INTRAMUSCULAR | Status: DC | PRN

## 2017-06-17 MED ORDER — ONDANSETRON HCL 4 MG/2ML IJ SOLN: 4.0000 mg | Freq: Four times a day (QID) | INTRAMUSCULAR | Status: DC | PRN

## 2017-06-17 MED ORDER — DEXAMETHASONE SODIUM PHOSPHATE 10 MG/ML IJ SOLN: 8.0000 mg | Freq: Once | INTRAMUSCULAR | Status: DC

## 2017-06-17 MED ORDER — CEFAZOLIN SODIUM-DEXTROSE 2-4 GM/100ML-% IV SOLN
2.0000 g | INTRAVENOUS | Status: AC
  Administered 2017-06-17: 2 g via INTRAVENOUS

## 2017-06-17 MED ORDER — ALBUMIN HUMAN 5 % IV SOLN
12.5000 g | Freq: Once | INTRAVENOUS | Status: AC
  Administered 2017-06-17: 12.5 g via INTRAVENOUS

## 2017-06-17 MED ORDER — PHENYLEPHRINE HCL 10 MG/ML IJ SOLN
INTRAMUSCULAR | Status: DC | PRN
  Administered 2017-06-17: 80 ug via INTRAVENOUS

## 2017-06-17 MED ORDER — SODIUM CHLORIDE 0.9 % IR SOLN
Status: DC | PRN
  Administered 2017-06-17: 1000 mL

## 2017-06-17 MED ORDER — PROPOFOL 500 MG/50ML IV EMUL
INTRAVENOUS | Status: DC | PRN
  Administered 2017-06-17: 25 ug/kg/min via INTRAVENOUS

## 2017-06-17 MED ORDER — ALUM & MAG HYDROXIDE-SIMETH 200-200-20 MG/5ML PO SUSP: 30.0000 mL | ORAL | Status: DC | PRN

## 2017-06-17 MED ORDER — MIDAZOLAM HCL 2 MG/2ML IJ SOLN
INTRAMUSCULAR | Status: AC
  Filled 2017-06-17: qty 2

## 2017-06-17 MED ORDER — POVIDONE-IODINE 10 % EX SWAB: 2.0000 "application " | Freq: Once | CUTANEOUS | Status: DC

## 2017-06-17 MED ORDER — MIDAZOLAM HCL 5 MG/5ML IJ SOLN
INTRAMUSCULAR | Status: DC | PRN
  Administered 2017-06-17 (×2): 1 mg via INTRAVENOUS

## 2017-06-17 MED ORDER — BUPIVACAINE IN DEXTROSE 0.75-8.25 % IT SOLN
INTRATHECAL | Status: DC | PRN
  Administered 2017-06-17: 10 mg via INTRATHECAL

## 2017-06-17 MED ORDER — SODIUM CHLORIDE 0.9 % IV SOLN: INTRAVENOUS | Status: DC

## 2017-06-17 MED ORDER — MORPHINE SULFATE (PF) 4 MG/ML IV SOLN
0.5000 mg | INTRAVENOUS | Status: DC | PRN
Start: 2017-06-17 — End: 2017-06-18
  Administered 2017-06-17 – 2017-06-18 (×2): 0.52 mg via INTRAVENOUS
  Filled 2017-06-17 (×2): qty 1

## 2017-06-17 MED ORDER — METOCLOPRAMIDE HCL 5 MG/ML IJ SOLN: 5.0000 mg | Freq: Three times a day (TID) | INTRAMUSCULAR | Status: DC | PRN

## 2017-06-17 MED ORDER — MORPHINE SULFATE (PF) 4 MG/ML IV SOLN: 0.5000 mg | INTRAVENOUS | Status: DC | PRN

## 2017-06-17 MED ORDER — PHENOL 1.4 % MT LIQD
1.0000 | OROMUCOSAL | Status: DC | PRN
  Filled 2017-06-17: qty 177

## 2017-06-17 MED ORDER — FENTANYL CITRATE (PF) 100 MCG/2ML IJ SOLN
INTRAMUSCULAR | Status: DC | PRN
  Administered 2017-06-17 (×2): 50 ug via INTRAVENOUS

## 2017-06-17 MED ORDER — CHLORHEXIDINE GLUCONATE 4 % EX LIQD: 60.0000 mL | Freq: Once | CUTANEOUS | Status: DC

## 2017-06-17 MED ORDER — ORAL CARE MOUTH RINSE
15.0000 mL | Freq: Two times a day (BID) | OROMUCOSAL | Status: DC
  Administered 2017-06-18 – 2017-06-21 (×5): 15 mL via OROMUCOSAL

## 2017-06-17 MED ORDER — LIDOCAINE 2% (20 MG/ML) 5 ML SYRINGE
INTRAMUSCULAR | Status: AC
  Filled 2017-06-17: qty 5

## 2017-06-17 MED ORDER — CHLORHEXIDINE GLUCONATE CLOTH 2 % EX PADS
6.0000 | MEDICATED_PAD | Freq: Every day | CUTANEOUS | Status: DC
  Administered 2017-06-18 – 2017-06-20 (×3): 6 via TOPICAL

## 2017-06-17 MED ORDER — DEXAMETHASONE SODIUM PHOSPHATE 10 MG/ML IJ SOLN
INTRAMUSCULAR | Status: AC
  Filled 2017-06-17: qty 1

## 2017-06-17 MED ORDER — METOCLOPRAMIDE HCL 5 MG PO TABS: 5.0000 mg | ORAL_TABLET | Freq: Three times a day (TID) | ORAL | Status: DC | PRN

## 2017-06-17 MED ORDER — ONDANSETRON HCL 4 MG/2ML IJ SOLN
INTRAMUSCULAR | Status: AC
  Filled 2017-06-17: qty 2

## 2017-06-17 MED ORDER — ACETAMINOPHEN 650 MG RE SUPP: 650.0000 mg | Freq: Four times a day (QID) | RECTAL | Status: DC | PRN

## 2017-06-17 MED ORDER — ACETAMINOPHEN 325 MG PO TABS: 650.0000 mg | ORAL_TABLET | Freq: Four times a day (QID) | ORAL | Status: DC | PRN

## 2017-06-17 MED ORDER — ROCURONIUM BROMIDE 50 MG/5ML IV SOSY
PREFILLED_SYRINGE | INTRAVENOUS | Status: AC
  Filled 2017-06-17: qty 5

## 2017-06-17 MED ORDER — SODIUM CHLORIDE 0.9 % IV SOLN
INTRAVENOUS | Status: DC
  Administered 2017-06-17: 22:00:00 via INTRAVENOUS

## 2017-06-17 MED ORDER — ONDANSETRON HCL 4 MG PO TABS: 4.0000 mg | ORAL_TABLET | Freq: Four times a day (QID) | ORAL | Status: DC | PRN

## 2017-06-17 MED ORDER — CEFAZOLIN SODIUM-DEXTROSE 2-4 GM/100ML-% IV SOLN
INTRAVENOUS | Status: AC
  Filled 2017-06-17: qty 100

## 2017-06-17 SURGICAL SUPPLY — 50 items
BAG ZIPLOCK 12X15 (MISCELLANEOUS) ×3 IMPLANT
BLADE SAW SGTL 11.0X1.19X90.0M (BLADE) IMPLANT
BLADE SAW SGTL 18X1.27X75 (BLADE) ×2 IMPLANT
BLADE SAW SGTL 18X1.27X75MM (BLADE) ×1
CAPT HIP HEMI 2 ×3 IMPLANT
CLOSURE WOUND 1/2 X4 (GAUZE/BANDAGES/DRESSINGS) ×2
COVER SURGICAL LIGHT HANDLE (MISCELLANEOUS) ×3 IMPLANT
DERMABOND ADVANCED (GAUZE/BANDAGES/DRESSINGS) ×2
DERMABOND ADVANCED .7 DNX12 (GAUZE/BANDAGES/DRESSINGS) ×1 IMPLANT
DRAPE ORTHO SPLIT 77X108 STRL (DRAPES) ×4
DRAPE POUCH INSTRU U-SHP 10X18 (DRAPES) ×3 IMPLANT
DRAPE SURG 17X11 SM STRL (DRAPES) ×3 IMPLANT
DRAPE SURG ORHT 6 SPLT 77X108 (DRAPES) ×2 IMPLANT
DRAPE U-SHAPE 47X51 STRL (DRAPES) ×3 IMPLANT
DRSG AQUACEL AG ADV 3.5X10 (GAUZE/BANDAGES/DRESSINGS) ×3 IMPLANT
DRSG TEGADERM 4X4.75 (GAUZE/BANDAGES/DRESSINGS) ×3 IMPLANT
DURAPREP 26ML APPLICATOR (WOUND CARE) ×3 IMPLANT
ELECT BLADE TIP CTD 4 INCH (ELECTRODE) ×3 IMPLANT
ELECT REM PT RETURN 15FT ADLT (MISCELLANEOUS) ×3 IMPLANT
EVACUATOR 1/8 PVC DRAIN (DRAIN) ×3 IMPLANT
FACESHIELD WRAPAROUND (MASK) ×9 IMPLANT
GAUZE SPONGE 2X2 8PLY STRL LF (GAUZE/BANDAGES/DRESSINGS) ×1 IMPLANT
GLOVE BIOGEL M 7.0 STRL (GLOVE) IMPLANT
GLOVE BIOGEL PI IND STRL 7.5 (GLOVE) ×6 IMPLANT
GLOVE BIOGEL PI IND STRL 8.5 (GLOVE) ×1 IMPLANT
GLOVE BIOGEL PI INDICATOR 7.5 (GLOVE) ×12
GLOVE BIOGEL PI INDICATOR 8.5 (GLOVE) ×2
GLOVE ECLIPSE 8.0 STRL XLNG CF (GLOVE) IMPLANT
GLOVE ORTHO TXT STRL SZ7.5 (GLOVE) ×6 IMPLANT
GLOVE SURG ORTHO 8.0 STRL STRW (GLOVE) ×3 IMPLANT
GOWN STRL REUS W/TWL LRG LVL3 (GOWN DISPOSABLE) ×9 IMPLANT
GOWN STRL REUS W/TWL XL LVL3 (GOWN DISPOSABLE) ×6 IMPLANT
HANDPIECE INTERPULSE COAX TIP (DISPOSABLE) ×2
IMMOBILIZER KNEE 20 (SOFTGOODS) ×3
IMMOBILIZER KNEE 20 THIGH 36 (SOFTGOODS) ×1 IMPLANT
KIT BASIN OR (CUSTOM PROCEDURE TRAY) ×3 IMPLANT
MANIFOLD NEPTUNE II (INSTRUMENTS) ×3 IMPLANT
PACK TOTAL JOINT (CUSTOM PROCEDURE TRAY) ×3 IMPLANT
POSITIONER SURGICAL ARM (MISCELLANEOUS) ×3 IMPLANT
SET HNDPC FAN SPRY TIP SCT (DISPOSABLE) ×1 IMPLANT
SPONGE GAUZE 2X2 STER 10/PKG (GAUZE/BANDAGES/DRESSINGS) ×2
STRIP CLOSURE SKIN 1/2X4 (GAUZE/BANDAGES/DRESSINGS) ×4 IMPLANT
SUT ETHIBOND NAB CT1 #1 30IN (SUTURE) ×3 IMPLANT
SUT MNCRL AB 4-0 PS2 18 (SUTURE) ×3 IMPLANT
SUT VIC AB 1 CT1 36 (SUTURE) ×6 IMPLANT
SUT VIC AB 2-0 CT1 27 (SUTURE) ×4
SUT VIC AB 2-0 CT1 TAPERPNT 27 (SUTURE) ×2 IMPLANT
SUT VLOC 180 0 24IN GS25 (SUTURE) ×3 IMPLANT
TOWEL OR 17X26 10 PK STRL BLUE (TOWEL DISPOSABLE) ×6 IMPLANT
TRAY FOLEY W/METER SILVER 16FR (SET/KITS/TRAYS/PACK) IMPLANT

## 2017-06-17 NOTE — Transfer of Care (Signed)
Immediate Anesthesia Transfer of Care Note  Patient: Sydney Welch  Procedure(s) Performed: ARTHROPLASTY BIPOLAR HIP (HEMIARTHROPLASTY) (Right )  Patient Location: PACU  Anesthesia Type:Spinal  Level of Consciousness: sedated and responds to stimulation  Airway & Oxygen Therapy: Patient Spontanous Breathing and Patient connected to face mask oxygen  Post-op Assessment: Report given to RN and Post -op Vital signs reviewed and stable  Post vital signs: Reviewed and stable  Last Vitals:  Vitals:   06/17/17 1447 06/17/17 1612  BP: 124/78   Pulse: 83 75  Resp: 16   Temp: 36.6 C   SpO2: 99% 100%    Last Pain:  Vitals:   06/17/17 1447  TempSrc: Axillary  PainSc:          Complications: No apparent anesthesia complications

## 2017-06-17 NOTE — Progress Notes (Signed)
Initial Nutrition Assessment  DOCUMENTATION CODES:   Not applicable  INTERVENTION:  - Diet advancement as medically feasible. - RD will provide appropriate interventions with diet advancement/at time of follow-up.   NUTRITION DIAGNOSIS:   Inadequate oral intake related to inability to eat as evidenced by NPO status.  GOAL:   Patient will meet greater than or equal to 90% of their needs  MONITOR:   Diet advancement, Weight trends, Labs  REASON FOR ASSESSMENT:   Consult Hip fracture protocol  ASSESSMENT:   82 y.o. female with medical history significant of tachycardia and Alzheimer's with behavioral disturbance presenting with excessive somnolence. She fell about 4 times in the last month.  Today, she was sleeping 18 hours and it was hard to get her awake.  She has been sleeping more than usual over the last 3-4 days.  She has 2 skin cancers and she was supposed to go for consultation tomorrow to have them removed.  She has been having pain - moaning and groaning with being moved.  Over the last week or two she hasn't been able to walk at all - which they thought was due to progressive dementia.  Dr. Pete Glatter evaluated her about 2 1/2 weeks ago and watched her walk and wasn't concerned at that time.  Daughter thinks the fracture may have happened a month ago but she has been able to walk since then.  She has not been able to swallow her Ativan and Verapamil the last couple of days.  Pt seen per protocol. BMI and estimated nutrition needs unable to be determined at this time as no height or weight available in the chart. RD checked paper chart but no values for these anthropometrics available in paper chart either. Pt has been NPO since admission. She is sleeping soundly at this time and noted to be A/O to self only with hx of Alzheimer's.  Daughter, who is at bedside, reports all information. She states that she believes that pt broke her hip x1 month ago and that pt has had 3 "minor"  falls since that time. She states that pt often does not sleep well and that in the past month she would wake up moaning as if in pain. She began sleeping longer during the day (up to 18 hours/day) PTA so daughter knew that something was wrong. She states that intakes of food and drink have drastically declined in the past few months and have been worse in the past 1 month. She states that the frequency and amount of urine and bowel movements has been decreased over the past 1 month. Pt has had progressive difficulty with chewing and swallowing pills. She is unable to swallow larger pills and family needs to cut foods into small bites in order for pt to tolerate them without choking.  Physical assessment deferred at this time as pt was sleeping soundly. Will attempt at follow-up.   Medications reviewed. Labs reviewed.  IVF: LR @ 75 mL/hr.    Diet Order:  Diet NPO time specified Except for: Sips with Meds  Skin:  Reviewed, no issues  Last BM:  10/1  Height:   Ht Readings from Last 1 Encounters:  No data found for Ht    Weight:   Wt Readings from Last 1 Encounters:  No data found for Wt    Ideal Body Weight:     BMI:  There is no height or weight on file to calculate BMI.  Estimated Nutritional Needs:   Kcal:  Protein:     Fluid:     EDUCATION NEEDS:   No education needs identified at this time    Trenton Gammon, MS, RD, LDN, [facility] Inpatient Clinical Dietitian Pager # [phone] After hours/weekend pager # [phone]

## 2017-06-17 NOTE — Progress Notes (Signed)
Patient ID: Sydney Welch, female   DOB: [birth year], 82 y.o.   MRN: [id number]    PROGRESS NOTE    NABRIA NEVIN  WUJ:[id number] DOB: 20-Jan-1935 DOA: 06/16/2017  PCP: Merlene Laughter, MD   Brief Narrative:  82 y.o. female with medical history significant of tachycardia and Alzheimer's with behavioral disturbance presenting with excessive somnolence. She fell about 4 times in the last month.  Assessment & Plan:   Hip fracture - Mechanical falls resulting in hip fracture, uncertain timeframe of injury - ortho consulted, will follow up on recommendations  - keep NPO for now - analgesia as needed   Dementia - pt rather somnolent this AM - will discuss with family GOC when they arrive   Tachycardia - in normal sinus rhythm, continue Verapamil   DVT prophylaxis: SCD Code Status: DNR Family Communication: No family at bedside  Disposition Plan: to be determined   Consultants:   Ortho  Procedures:   None  Antimicrobials:   None   Subjective: No events overnight.   Objective: Vitals:   06/17/17 0500 06/17/17 0600 06/17/17 0700 06/17/17 0820  BP: (!) 85/55 126/74 139/67 128/71  Pulse: (!) 59 80 82 79  Resp: 18 (!) Temp:    97.7 F (36.5 C)  TempSrc:    Oral  SpO2: 95% 100% 98% 94%    Intake/Output Summary (Last 24 hours) at 06/17/17 1257 Last data filed at 06/17/17 0154  Gross per 24 hour  Intake                0 ml  Output                2 ml  Net               -2 ml   There were no vitals filed for this visit.  Examination:  General exam: Appears somnolent, difficult to awake  Respiratory system: Respiratory effort normal. Cardiovascular system: S1 & S2 heard, RRR. No rubs, gallops or clicks. Gastrointestinal system: Abdomen is nondistended, soft and nontender. No organomegaly or masses felt. Normal bowel sounds heard.  Data Reviewed: I have personally reviewed following labs and imaging studies  CBC:  Recent Labs Lab  06/16/17 1635  WBC 6.7  NEUTROABS 4.8  HGB 16.3*  HCT 48.1*  MCV 93.0  PLT 400   Basic Metabolic Panel:  Recent Labs Lab 06/16/17 1635  NA 140  K 4.1  CL 101  CO2 26  GLUCOSE 91  BUN 14  CREATININE 0.56  CALCIUM 9.7   Liver Function Tests:  Recent Labs Lab 06/16/17 1635  AST 27  ALT 16  ALKPHOS 63  BILITOT 1.1  PROT 7.6  ALBUMIN 4.4   Cardiac Enzymes:  Recent Labs Lab 06/16/17 1635  TROPONINI <0.03   Urine analysis:    Component Value Date/Time   COLORURINE AMBER (A) 06/16/2017 1635   APPEARANCEUR CLOUDY (A) 06/16/2017 1635   LABSPEC 1.030 06/16/2017 1635   PHURINE 5.0 06/16/2017 1635   GLUCOSEU NEGATIVE 06/16/2017 1635   HGBUR NEGATIVE 06/16/2017 1635   BILIRUBINUR SMALL (A) 06/16/2017 1635   KETONESUR 20 (A) 06/16/2017 1635   PROTEINUR 30 (A) 06/16/2017 1635   NITRITE NEGATIVE 06/16/2017 1635   LEUKOCYTESUR NEGATIVE 06/16/2017 1635   Recent Results (from the past 240 hour(s))  Blood Culture (routine x 2)     Status: None (Preliminary result)   Collection Time: 06/16/17  1:40 PM  Result Value Ref Range Status  Specimen Description BLOOD LEFT ARM  Final   Special Requests   Final    Blood Culture adequate volume BOTTLES DRAWN AEROBIC AND ANAEROBIC   Culture   Final    NO GROWTH < 24 HOURS Performed at [facility], [address] [place], [place] [address]    Report Status PENDING  Incomplete  Blood Culture (routine x 2)     Status: None (Preliminary result)   Collection Time: 06/16/17  5:00 PM  Result Value Ref Range Status   Specimen Description BLOOD LEFT FOREARM  Final   Special Requests   Final    Blood Culture adequate volume BOTTLES DRAWN AEROBIC AND ANAEROBIC   Culture   Final    NO GROWTH < 24 HOURS Performed at [facility], [address], [place], [place] [address]    Report Status PENDING  Incomplete    Radiology Studies: Dg Chest 2 View  Result Date: 06/16/2017 CLINICAL DATA:  Mental status change. EXAM:  CHEST  2 VIEW COMPARISON:  None. FINDINGS: The heart size is normal. The lungs are hyperinflated but clear. No pleural effusion or edema. Thoracic scoliosis identified. IMPRESSION: 1. No acute cardiopulmonary abnormalities. 2. Scoliosis. Electronically Signed   By: Signa Kell M.D.   On: 06/16/2017 17:57   Dg Pelvis Portable  Result Date: 06/16/2017 CLINICAL DATA:  Right hip fracture noted on recent CT scan today. EXAM: PORTABLE PELVIS 1-2 VIEWS COMPARISON:  Plain films earlier today as well as CT earlier today. FINDINGS: Diffuse osteopenia. Mild degenerative changes of the hips right worse than left. There is foreshortening of the right femoral neck as a discrete fracture line is not visualized. Degenerative change of the spine. Pelvic phleboliths. IMPRESSION: Foreshortening of the right femoral neck without definite fracture line visualized. Mild degenerative changes of the hips right worse than left. Electronically Signed   By: Elberta Fortis M.D.   On: 06/16/2017 23:16   Dg Abd 2 Views  Result Date: 06/16/2017 CLINICAL DATA:  Abdominal pain. EXAM: ABDOMEN - 2 VIEW COMPARISON:  None. FINDINGS: The bowel gas pattern is normal. Moderate stool burden identified within the colon. There is no evidence of free air. No radio-opaque calculi or other significant radiographic abnormality is seen. IMPRESSION: Nonobstructive bowel gas pattern. Moderate stool burden within the colon. Electronically Signed   By: Signa Kell M.D.   On: 06/16/2017 18:00   Ct Renal Stone Study  Result Date: 06/16/2017 CLINICAL DATA:  Altered mental status with possible UTI. EXAM: CT ABDOMEN AND PELVIS WITHOUT CONTRAST TECHNIQUE: Multidetector CT imaging of the abdomen and pelvis was performed following the standard protocol without IV contrast. COMPARISON:  None. FINDINGS: Lower chest: Minimal dependent atelectasis over the right base. Hepatobiliary: The liver and biliary tree are within normal. Possible sludge versus small  stones within the dependent portion of the gallbladder. Pancreas: Pancreas somewhat difficult to visualize on this noncontrast study although no definite abnormality. Spleen: Within normal. Adrenals/Urinary Tract: Adrenal glands are normal. Kidneys are normal in size without hydronephrosis or nephrolithiasis. 1.1 cm cyst over the mid to upper pole left kidney with subcentimeter hyperdensity over the corticomedullary junction of the mid to upper pole left kidney with Hounsfield unit measurements of 99 likely early calcification. Ureters are unremarkable. Tiny focus of air within the nondependent portion of the bladder compatible with known recent catheterization. Stomach/Bowel: The stomach and small bowel are within normal. Appendix is normal. There is diverticulosis of the colon with mild fecal retention throughout the  colon. Vascular/Lymphatic: Minimal calcified plaque over the abdominal aorta. No significant adenopathy. Reproductive: Within normal. Other: No significant free fluid or focal inflammatory change. No free peritoneal air. Musculoskeletal: Moderate degenerate changes spine with curvature of the thoracolumbar spine convex left. Mild compression fracture of L4 age indeterminate. Mild degenerative change of the hips. Subcapital fracture of the right femoral neck likely subacute nature with minimal displacement. IMPRESSION: No acute findings in the abdomen/pelvis. Minimally displaced subcapital fracture of the right femoral neck likely subacute nature. Suggestion of mild gallbladder sludge versus stones. 1.1 cm left renal cyst with suggestion mild left focal nephrocalcinosis. Diverticulosis of the colon. Mild L4 compression fracture likely chronic. These results were called by telephone at the time of interpretation on 06/16/2017 at 8:33 pm to Dr. Jacalyn Lefevre , who verbally acknowledged these results. Electronically Signed   By: Elberta Fortis M.D.   On: 06/16/2017 20:33    Scheduled Meds: .  chlorhexidine  60 mL Topical Once  . dexamethasone  8 mg Intravenous Once  . mouth rinse  15 mL Mouth Rinse BID  . povidone-iodine  2 application Topical Once  . verapamil  240 mg Oral Daily   Continuous Infusions: . sodium chloride    .  ceFAZolin (ANCEF) IV    . lactated ringers 75 mL/hr at 06/17/17 0015  . methocarbamol (ROBAXIN)  IV       LOS: 1 day   Time spent: 25 minutes   Debbora Presto, MD [facility] Pager [phone]  If 7PM-7AM, please contact night-coverage [email or web address] Password [email or web address] 06/17/2017, 12:57 PM

## 2017-06-17 NOTE — Anesthesia Postprocedure Evaluation (Signed)
Anesthesia Post Note  Patient: Sydney Welch  Procedure(s) Performed: ARTHROPLASTY BIPOLAR HIP (HEMIARTHROPLASTY) (Right )     Patient location during evaluation: PACU Anesthesia Type: Spinal Level of consciousness: awake and confused Pain management: pain level controlled Vital Signs Assessment: post-procedure vital signs reviewed and stable Respiratory status: spontaneous breathing, respiratory function stable and nonlabored ventilation Cardiovascular status: blood pressure returned to baseline Anesthetic complications: no    Last Vitals:  Vitals:   06/17/17 1930 06/17/17 1945  BP: 104/68 117/74  Pulse:  85  Resp: 17 15  Temp:    SpO2: 100%     Last Pain:  Vitals:   06/17/17 1945  TempSrc:   PainSc: Asleep                 Tranell Wojtkiewicz COKER

## 2017-06-17 NOTE — Anesthesia Preprocedure Evaluation (Addendum)
Anesthesia Evaluation  Patient identified by MRN, date of birth, ID band Patient confused    Reviewed: Allergy & Precautions, NPO status , Patient's Chart, lab work & pertinent test results  Airway Mallampati: II  TM Distance: >3 FB     Dental   Pulmonary    breath sounds clear to auscultation       Cardiovascular  Rhythm:Regular Rate:Normal     Neuro/Psych    GI/Hepatic   Endo/Other    Renal/GU      Musculoskeletal   Abdominal   Peds  Hematology   Anesthesia Other Findings   Reproductive/Obstetrics                             Anesthesia Physical Anesthesia Plan  ASA: III  Anesthesia Plan: Spinal   Post-op Pain Management:    Induction: Intravenous  PONV Risk Score and Plan: Ondansetron  Airway Management Planned: Natural Airway and Simple Face Mask  Additional Equipment:   Intra-op Plan:   Post-operative Plan:   Informed Consent: I have reviewed the patients History and Physical, chart, labs and discussed the procedure including the risks, benefits and alternatives for the proposed anesthesia with the patient or authorized representative who has indicated his/her understanding and acceptance.     Plan Discussed with: CRNA and Anesthesiologist  Anesthesia Plan Comments:         Anesthesia Quick Evaluation

## 2017-06-17 NOTE — Consult Note (Signed)
Reason for Consult: Right hip fracture Referring Physician:  Izola Price, MD  Sydney Welch is an 82 y.o. female.  HPI:  82 yo female with dementia who lives with family had a fall maybe a month ago.  Initial eval did not have X-rays in PCP office due to tlack of complaints.  Presented yesterday to ER by family because of increasing complaints of pain.  No other major complaints  Resting comfortably at time of evaluation   Past Medical History:  Diagnosis Date  . Alzheimer disease   . Anxiety   . Tachycardia   . Vertigo     History reviewed. No pertinent surgical history.  History reviewed. No pertinent family history.  Social History:  reports that she has never smoked. She has never used smokeless tobacco. She reports that she does not drink alcohol or use drugs.  Allergies:  Allergies  Allergen Reactions  . Caffeine     Medications:  I have reviewed the patient's current medications. Scheduled: . chlorhexidine  60 mL Topical Once  . dexamethasone  8 mg Intravenous Once  . mouth rinse  15 mL Mouth Rinse BID  . povidone-iodine  2 application Topical Once  . verapamil  240 mg Oral Daily    Results for orders placed or performed during the hospital encounter of 06/16/17 (from the past 24 hour(s))  Blood Culture (routine x 2)     Status: None (Preliminary result)   Collection Time: 06/16/17  1:40 PM  Result Value Ref Range   Specimen Description BLOOD LEFT ARM    Special Requests      Blood Culture adequate volume BOTTLES DRAWN AEROBIC AND ANAEROBIC   Culture      NO GROWTH < 24 HOURS Performed at [facility], [address] [place], [place] [address]    Report Status PENDING   Urinalysis, Routine w reflex microscopic- may I&O cath if menses     Status: Abnormal   Collection Time: 06/16/17  4:35 PM  Result Value Ref Range   Color, Urine AMBER (A) YELLOW   APPearance CLOUDY (A) CLEAR   Specific Gravity, Urine 1.030 1.005 - 1.030   pH 5.0 5.0 - 8.0   Glucose, UA NEGATIVE NEGATIVE mg/dL   Hgb urine dipstick NEGATIVE NEGATIVE   Bilirubin Urine SMALL (A) NEGATIVE   Ketones, ur 20 (A) NEGATIVE mg/dL   Protein, ur 30 (A) NEGATIVE mg/dL   Nitrite NEGATIVE NEGATIVE   Leukocytes, UA NEGATIVE NEGATIVE   RBC / HPF TOO NUMEROUS TO COUNT 0 - 5 RBC/hpf   WBC, UA NONE SEEN 0 - 5 WBC/hpf   Bacteria, UA NONE SEEN NONE SEEN   Squamous Epithelial / LPF 0-5 (A) NONE SEEN   Mucus PRESENT    Granular Casts, UA PRESENT   Comprehensive metabolic panel     Status: None   Collection Time: 06/16/17  4:35 PM  Result Value Ref Range   Sodium 140 135 - 145 mmol/L   Potassium 4.1 3.5 - 5.1 mmol/L   Chloride 101 101 - 111 mmol/L   CO2 26 22 - 32 mmol/L   Glucose, Bld 91 65 - 99 mg/dL   BUN 14 6 - 20 mg/dL   Creatinine, Ser 6.04 0.44 - 1.00 mg/dL   Calcium 9.7 8.9 - 54.0 mg/dL   Total Protein 7.6 6.5 - 8.1 g/dL   Albumin 4.4 3.5 - 5.0 g/dL   AST 27 15 - 41 U/L   ALT 16 14 - 54 U/L  Alkaline Phosphatase 63 38 - 126 U/L   Total Bilirubin 1.1 0.3 - 1.2 mg/dL   GFR calc non Af Amer >60 >60 mL/min   GFR calc Af Amer >60 >60 mL/min   Anion gap 13 5 - 15  CBC WITH DIFFERENTIAL     Status: Abnormal   Collection Time: 06/16/17  4:35 PM  Result Value Ref Range   WBC 6.7 4.0 - 10.5 K/uL   RBC 5.17 (H) 3.87 - 5.11 MIL/uL   Hemoglobin 16.3 (H) 12.0 - 15.0 g/dL   HCT 62.1 (H) 30.8 - 65.7 %   MCV 93.0 78.0 - 100.0 fL   MCH 31.5 26.0 - 34.0 pg   MCHC 33.9 30.0 - 36.0 g/dL   RDW 84.6 96.2 - 95.2 %   Platelets 400 150 - 400 K/uL   Neutrophils Relative % 71 %   Neutro Abs 4.8 1.7 - 7.7 K/uL   Lymphocytes Relative 20 %   Lymphs Abs 1.3 0.7 - 4.0 K/uL   Monocytes Relative 9 %   Monocytes Absolute 0.6 0.1 - 1.0 K/uL   Eosinophils Relative 0 %   Eosinophils Absolute 0.0 0.0 - 0.7 K/uL   Basophils Relative 0 %   Basophils Absolute 0.0 0.0 - 0.1 K/uL  Troponin I     Status: None   Collection Time: 06/16/17  4:35 PM  Result Value Ref Range   Troponin I <0.03  <0.03 ng/mL  I-Stat CG4 Lactic Acid, ED  (not at  [facility])     Status: None   Collection Time: 06/16/17  4:44 PM  Result Value Ref Range   Lactic Acid, Venous 1.01 0.5 - 1.9 mmol/L  Blood Culture (routine x 2)     Status: None (Preliminary result)   Collection Time: 06/16/17  5:00 PM  Result Value Ref Range   Specimen Description BLOOD LEFT FOREARM    Special Requests      Blood Culture adequate volume BOTTLES DRAWN AEROBIC AND ANAEROBIC   Culture      NO GROWTH < 24 HOURS Performed at [facility], [address], [place], [place] [address]    Report Status PENDING     X-ray: CLINICAL DATA:  Right hip fracture noted on recent CT scan today.  EXAM: PORTABLE PELVIS 1-2 VIEWS  COMPARISON:  Plain films earlier today as well as CT earlier today.  FINDINGS: Diffuse osteopenia. Mild degenerative changes of the hips right worse than left. There is foreshortening of the right femoral neck as a discrete fracture line is not visualized. Degenerative change of the spine. Pelvic phleboliths.  IMPRESSION: Foreshortening of the right femoral neck without definite fracture line visualized.  Mild degenerative changes of the hips right worse than left.   Electronically Signed   By: Elberta Fortis M.D.  ROS  Dementia Family reports otherwise no recent hospitalization  Blood pressure 128/71, pulse 79, temperature 97.7 F (36.5 C), temperature source Oral, resp. rate 16, SpO2 94 %.  Physical Exam: Comfortable  Right leg slightly shortened Moves foot and toes with gentle assessment  Upper extremities normal LLE normal  Assessment/Plan: Right displaced femoral neck fracture  NPO Consent ordered To OR today for right hip hemiarthroplasty Reviewed care plan with her daughter Flavia Shipper 06/17/2017, 12:22 PM

## 2017-06-17 NOTE — Anesthesia Procedure Notes (Signed)
Spinal  Start time: 06/17/2017 4:50 PM End time: 06/17/2017 5:05 PM Reason for block: at surgeon's request Staffing Anesthesiologist: ODDONO, ERNEST Performed: anesthesiologist  Preanesthetic Checklist Completed: patient identified, surgical consent, pre-op evaluation, IV checked and monitors and equipment checked Spinal Block Patient position: left lateral decubitus Prep: ChloraPrep Patient monitoring: heart rate, cardiac monitor, continuous pulse ox and blood pressure Approach: midline Location: L4-5 Needle Needle type: Quincke  Needle gauge: 22 G Additional Notes +CSF / DOSE /+CSF ASP

## 2017-06-17 NOTE — Op Note (Signed)
NAME:  Sydney Welch, Sydney Welch                ACCOUNT NO.:  [id number]   MEDICAL RECORD NO.: [id number]   LOCATION:  1435                         FACILITY:  [facility]   DATE OF BIRTH:  04-09-2035  PHYSICIAN:  Madlyn Frankel. Charlann Boxer, M.D.     DATE OF PROCEDURE:  06/17/2017                               OPERATIVE REPORT     PREOPERATIVE DIAGNOSIS:  Right displaced femoral neck fracture.   POSTOPERATIVE DIAGNOSIS:  Right displaced femoral neck fracture.   PROCEDURE:  Right hip hemiarthroplasty utilizing DePuy component, size 3 standard Tri-Lock stem with a 43mm unipolar ball with a +10 adapter.   SURGEON:  Madlyn Frankel. Charlann Boxer, MD   ASSISTANT:  Leilani Able, PA-C.   ANESTHESIA:  General.   SPECIMENS:  None.   DRAINS:  None   BLOOD LOSS:  About 50 cc.   COMPLICATIONS:  None.   INDICATION OF PROCEDURE:  Sydney Welch is a pleasantly demented 82 year old female who lives with family.  She unfortunately had a fall at her house maybe a month ago as the family recalls that could have resulted in the injury however due to lack of great communication ability there was delay in presenting to the ER.  She was admitted to the hospital after radiographs revealed a displaced femoral neck fracture.  She was seen and evaluated and was scheduled for surgery for fixation.  The necessity of surgical repair was discussed with her family as HCPOA.  Consent was obtained after reviewing risks of infection, DVT, component failure, and need for revision surgery.   PROCEDURE IN DETAIL:  The patient was brought to the operative theater. Once adequate anesthesia, preoperative antibiotics, 2 g of Ancef administered, the patient was positioned into the left lateral decubitus position with the right side up.  The right lower extremity was then prepped and draped in sterile fashion.  A time-out was performed identifying the patient, planned procedure, and extremity.   A lateral incision was made off the proximal trochanter.  Sharp dissection was carried down to the iliotibial band and gluteal fascia. The gluteal fascia was then incised for posterior approach.  The short external rotators were taken down separate from the posterior capsule. An L capsulotomy was made preserving the posterior leaflet for later anatomic repair. Fracture site was identified and after removing comminuted segments of the posterior femoral neck, the femoral head was removed without difficulty and measured on the back table  using the sizing rings and determined to be 43 mm in diameter.   The proximal femur was then exposed.  Retractors placed.  I then drilled, opened the proximal femur.  Then I hand reamed once and  Irrigated the canal to try to prevent fat emboli.  I began broaching the femur with a starter broach up to a size 3 broach with good medial and lateral metaphyseal fit without evidence of any torsion or movement.  A trial reduction was carried out with a standard offset neck and a +5 then +10 adapter with a 43mm ball.  The hip reduced nicely.  The leg lengths appeared to be equal compared to the down leg.   The hip went through a range of motion  without evidence of any subluxation or impingement.   Given these findings, the trial components removed.  The final 3 standard offset  Tri-Lock stem was opened.  After irrigating the canal, the final stem was impacted and sat at the level where the broach was. Based on this and the trial reduction, a +10 adapter was opened and impacted in the 43mm unipolar ball onto a clean and dry trunnion.  The hip was reduced.  The hip had been irrigated throughout the case and again at this point.  I re- Approximated the posterior capsule to the superior leaflet using a  #1 Vicryl.  The remainder of the wound was closed with #1 Vicryl and #0 Stratafix sutures in the iliotibial band and gluteal fascia, a  2-0 Vicryl in the sub-Q tissue and a running 4-0 Monocryl in the skin.  The hip was  cleaned, dried, and dressed sterilely using Dermabond and Aquacel dressing.  She was then brought to recovery room, extubated in stable condition, tolerating the procedure well.  Leilani Able, PA-C was present and utilized as Geophysicist/field seismologist for the entire case from  Preoperative positioning to management of the contralateral extremity and retractors to  General facilitation of the procedure.  He was also involved with primary wound closure.         Madlyn Frankel Charlann Boxer, M.D.

## 2017-06-18 ENCOUNTER — Encounter (HOSPITAL_COMMUNITY): Payer: Self-pay | Admitting: Orthopedic Surgery

## 2017-06-18 LAB — CBC
HEMATOCRIT: 32.1 % — AB (ref 36.0–46.0)
Hemoglobin: 10.9 g/dL — ABNORMAL LOW (ref 12.0–15.0)
MCH: 31.4 pg (ref 26.0–34.0)
MCHC: 34 g/dL (ref 30.0–36.0)
MCV: 92.5 fL (ref 78.0–100.0)
Platelets: 271 10*3/uL (ref 150–400)
RBC: 3.47 MIL/uL — ABNORMAL LOW (ref 3.87–5.11)
RDW: 12.9 % (ref 11.5–15.5)
WBC: 5.4 10*3/uL (ref 4.0–10.5)

## 2017-06-18 LAB — BASIC METABOLIC PANEL
Anion gap: 10 (ref 5–15)
BUN: 5 mg/dL — AB (ref 6–20)
CHLORIDE: 107 mmol/L (ref 101–111)
CO2: 23 mmol/L (ref 22–32)
Calcium: 8.2 mg/dL — ABNORMAL LOW (ref 8.9–10.3)
Creatinine, Ser: 0.41 mg/dL — ABNORMAL LOW (ref 0.44–1.00)
GFR calc Af Amer: >60 mL/min (ref >60)
GFR calc non Af Amer: >60 mL/min (ref >60)
Glucose, Bld: 90 mg/dL (ref 65–99)
POTASSIUM: 2.9 mmol/L — AB (ref 3.5–5.1)
Sodium: 140 mmol/L (ref 135–145)

## 2017-06-18 LAB — CREATININE, SERUM
CREATININE: 0.43 mg/dL — AB (ref 0.44–1.00)
GFR calc Af Amer: >60 mL/min (ref >60)
GFR calc non Af Amer: >60 mL/min (ref >60)

## 2017-06-18 MED ORDER — POTASSIUM CHLORIDE 10 MEQ/100ML IV SOLN
10.0000 meq | INTRAVENOUS | Status: DC
  Administered 2017-06-18: 08:00:00 10 meq via INTRAVENOUS
  Filled 2017-06-18 (×6): qty 100

## 2017-06-18 MED ORDER — POTASSIUM CHLORIDE IN NACL 40-0.9 MEQ/L-% IV SOLN
INTRAVENOUS | Status: DC
  Administered 2017-06-18 – 2017-06-20 (×3): 50 mL/h via INTRAVENOUS
  Filled 2017-06-18 (×4): qty 1000

## 2017-06-18 MED ORDER — POTASSIUM CHLORIDE CRYS ER 20 MEQ PO TBCR: 40.0000 meq | EXTENDED_RELEASE_TABLET | Freq: Once | ORAL | Status: DC

## 2017-06-18 MED ORDER — POTASSIUM CHLORIDE IN NACL 20-0.9 MEQ/L-% IV SOLN
INTRAVENOUS | Status: DC
  Filled 2017-06-18: qty 1000

## 2017-06-18 MED ORDER — SODIUM CHLORIDE 0.9 % IV SOLN
INTRAVENOUS | Status: DC
  Administered 2017-06-18: 08:00:00 via INTRAVENOUS

## 2017-06-18 MED ORDER — MORPHINE SULFATE (PF) 4 MG/ML IV SOLN
1.0000 mg | INTRAVENOUS | Status: DC | PRN
  Administered 2017-06-18 (×2): 2 mg via INTRAVENOUS
  Administered 2017-06-18 – 2017-06-20 (×5): 1 mg via INTRAVENOUS
  Filled 2017-06-18 (×9): qty 1

## 2017-06-18 MED ORDER — POTASSIUM CHLORIDE 20 MEQ/15ML (10%) PO SOLN
20.0000 meq | Freq: Two times a day (BID) | ORAL | Status: DC
  Administered 2017-06-19: 14:00:00 20 meq via ORAL
  Filled 2017-06-18 (×3): qty 15

## 2017-06-18 NOTE — Progress Notes (Signed)
Patient ID: Sydney Welch, female   DOB: October 07, 1934, 82 y.o.   MRN: [id number] Subjective: 1 Day Post-Op Procedure(s) (LRB): ARTHROPLASTY BIPOLAR HIP (HEMIARTHROPLASTY) (Right)    Patient resting comfortable after receiving some pain meds this am.  Family in room Reports that she is challenged orally taking pills  No events  Objective:   VITALS:   Vitals:   06/18/17 0040 06/18/17 0515  BP: 122/82 121/69  Pulse: 74 79  Resp:  15  Temp: 97.8 F (36.6 C) 98.3 F (36.8 C)  SpO2: 100% 95%    Neurovascular intact Incision: dressing C/D/I  LABS  Recent Labs  06/16/17 1635  HGB 16.3*  HCT 48.1*  WBC 6.7  PLT 400     Recent Labs  06/16/17 1635 06/18/17 0553  NA 140 140  K 4.1 2.9*  BUN 14 5*  CREATININE 0.56 0.41*  0.43*  GLUCOSE 91 90    No results for input(s): LABPT, INR in the last 72 hours.   Assessment/Plan: 1 Day Post-Op Procedure(s) (LRB): ARTHROPLASTY BIPOLAR HIP (HEMIARTHROPLASTY) (Right)   Advance diet Up with therapy Discharge home with home health   May need speech evaluation to evaluate swallowing capabilities to reduce risks of aspiration Liquid forms of pain meds will be needed K+ 2.9 will need supplementation D/C to home preferred by family during per-op discussion

## 2017-06-18 NOTE — NC FL2 (Signed)
[place] MEDICAID FL2 LEVEL OF CARE SCREENING TOOL     IDENTIFICATION  Patient Name: Sydney Welch Birthdate: Jul 21, 1935 Sex: female Admission Date (Current Location): 06/16/2017  [facility] and [place] Number:  Producer, television/film/video and Address:  [facility],  [address] [place], [place] [address]      Provider Number: [id number]  Attending Physician Name and Address:  Dorothea Ogle, MD  Relative Name and Phone Number:       Current Level of Care: Hospital Recommended Level of Care: Skilled Nursing Facility Prior Approval Number:    Date Approved/Denied:   PASRR Number:   [id number]   Discharge Plan: SNF    Current Diagnoses: Patient Active Problem List   Diagnosis Date Noted  . Hip fracture (HCC) 06/16/2017  . Dementia with behavioral disturbance 06/16/2017  . Tachycardia 06/16/2017    Orientation RESPIRATION BLADDER Height & Weight     Self  O2 (2L) Incontinent, External catheter Weight: 93 lb (42.2 kg) Height:   (157.5 cm)  BEHAVIORAL SYMPTOMS/MOOD NEUROLOGICAL BOWEL NUTRITION STATUS      Incontinent Diet (See DC summary)  AMBULATORY STATUS COMMUNICATION OF NEEDS Skin   Extensive Assist Verbally Surgical wounds, Skin abrasions, Bruising                       Personal Care Assistance Level of Assistance  Bathing, Feeding, Dressing Bathing Assistance: Maximum assistance Feeding assistance: Limited assistance Dressing Assistance: Maximum assistance     Functional Limitations Info  Sight, Hearing, Speech Sight Info: Adequate Hearing Info: Adequate Speech Info: Adequate    SPECIAL CARE FACTORS FREQUENCY  PT (By licensed PT), OT (By licensed OT), Speech therapy     PT Frequency: 5x OT Frequency: 5x     Speech Therapy Frequency: 2x      Contractures Contractures Info: Not present    Additional Factors Info  Code Status, Allergies, Isolation Precautions Code Status Info: Full Code Allergies Info: Caffeine      Isolation Precautions Info: On contact precautions: MRSA     Current Medications (06/18/2017):  This is the current hospital active medication list Current Facility-Administered Medications  Medication Dose Route Frequency Provider Last Rate Last Dose  . 0.9 %  sodium chloride infusion   Intravenous Continuous Durene Romans, MD 50 mL/hr at 06/18/17 [phone]    . acetaminophen (TYLENOL) tablet 650 mg  650 mg Oral Q6H PRN Durene Romans, MD       Or  . acetaminophen (TYLENOL) suppository 650 mg  650 mg Rectal Q6H PRN Durene Romans, MD      . alum & mag hydroxide-simeth (MAALOX/MYLANTA) 200-200-20 MG/5ML suspension 30 mL  30 mL Oral Q4H PRN Durene Romans, MD      . Chlorhexidine Gluconate Cloth 2 % PADS 6 each  6 each Topical Q0600 Durene Romans, MD      . docusate sodium (COLACE) capsule 100 mg  100 mg Oral BID Durene Romans, MD      . enoxaparin (LOVENOX) injection 40 mg  40 mg Subcutaneous Q24H Durene Romans, MD   40 mg at 06/18/17 1027  . HYDROcodone-acetaminophen (NORCO/VICODIN) 5-325 MG per tablet 1-2 tablet  1-2 tablet Oral Q6H PRN Durene Romans, MD      . LORazepam (ATIVAN) injection 1 mg  1 mg Intravenous Q6H PRN Jonah Blue, MD   1 mg at 06/18/17 0503  . MEDLINE mouth rinse  15 mL Mouth Rinse BID Dorothea Ogle, MD   15  mL at 06/18/17 0508  . menthol-cetylpyridinium (CEPACOL) lozenge 3 mg  1 lozenge Oral PRN Durene Romans, MD       Or  . phenol (CHLORASEPTIC) mouth spray 1 spray  1 spray Mouth/Throat PRN Durene Romans, MD      . methocarbamol (ROBAXIN) tablet 500 mg  500 mg Oral Q6H PRN Jonah Blue, MD       Or  . methocarbamol (ROBAXIN) 500 mg in dextrose 5 % 50 mL IVPB  500 mg Intravenous Q6H PRN Jonah Blue, MD      . metoCLOPramide (REGLAN) tablet 5-10 mg  5-10 mg Oral Q8H PRN Durene Romans, MD       Or  . metoCLOPramide (REGLAN) injection 5-10 mg  5-10 mg Intravenous Q8H PRN Durene Romans, MD      . morphine 4 MG/ML injection 1-2 mg  1-2 mg Intravenous Q2H PRN Dorothea Ogle, MD   1 mg at 06/18/17 1011  . mupirocin ointment (BACTROBAN) 2 % 1 application  1 application Nasal BID Durene Romans, MD   1 application at 06/18/17 0507  . ondansetron (ZOFRAN) tablet 4 mg  4 mg Oral Q6H PRN Durene Romans, MD       Or  . ondansetron [facility]) injection 4 mg  4 mg Intravenous Q6H PRN Durene Romans, MD      . polyethylene glycol (MIRALAX / GLYCOLAX) packet 17 g  17 g Oral Daily PRN Durene Romans, MD      . potassium chloride 20 MEQ/15ML (10%) solution 20 mEq  20 mEq Oral BID Durene Romans, MD      . verapamil (CALAN-SR) CR tablet 240 mg  240 mg Oral Daily Jonah Blue, MD   240 mg at 06/17/17 0014     Discharge Medications: Please see discharge summary for a list of discharge medications.  Relevant Imaging Results:  Relevant Lab Results:   Additional Information SSN:   [id number]  Raye Sorrow, [place]

## 2017-06-18 NOTE — Progress Notes (Signed)
Patient ID: Sydney Welch, female   DOB: 17-Mar-1935, 82 y.o.   MRN: [id number]    PROGRESS NOTE    Sydney Welch  WUJ:[id number] DOB: [birth year] DOA: 06/16/2017  PCP: Merlene Laughter, MD   Brief Narrative:  82 y.o. female with medical history significant of tachycardia and Alzheimer's with behavioral disturbance presenting with excessive somnolence. She fell about 4 times in the last month.  Assessment & Plan:   Hip fracture - Mechanical falls resulting in hip fracture, uncertain timeframe of injury - s/p right hemiarthroplasty, post op day #1, resting comfortably  - ok to advance diet but family concerned with swallowing - SLP requested - analgesia as needed   Dementia - will request SLP  Hypokalemia - supplement - BMP In AM  Tachycardia - cont Verapamil   DVT prophylaxis: SCD Code Status: DNR Family Communication: daughter at bedside  Disposition Plan: to be determined but likely home once pt more alert, tolerating PO, electrolytes stable   Consultants:   Ortho  Procedures:   None  Antimicrobials:   None   Subjective: No events overnight.   Objective: Vitals:   06/17/17 2238 06/17/17 2335 06/18/17 0040 06/18/17 0515  BP: 114/68 106/66 122/82 121/69  Pulse: 75 73 74 79  Resp:    15  Temp: 97.7 F (36.5 C) 97.9 F (36.6 C) 97.8 F (36.6 C) 98.3 F (36.8 C)  TempSrc: Axillary Axillary Rectal Axillary  SpO2: 98% 100% 100% 95%  Weight:    42.2 kg (93 lb)  Height:     (1.575 m)    Intake/Output Summary (Last 24 hours) at 06/18/17 9562 Last data filed at 06/18/17 0515  Gross per 24 hour  Intake          1220.83 ml  Output             1150 ml  Net            70.83 ml   Filed Weights   06/18/17 0515  Weight: 42.2 kg (93 lb)   Physical Exam  Constitutional: Appears calm, resting, NAD CVS: RRR, S1/S2 +, no gallops, no carotid bruit.  Pulmonary: Effort and breath sounds normal, diminished sounds at bases  Abdominal: Soft. BS +,  no  distension, tenderness, rebound or guarding.  Musculoskeletal:Some tenderness at the right hip area  Data Reviewed: I have personally reviewed following labs and imaging studies  CBC:  Recent Labs Lab 06/16/17 1635 06/18/17 0553  WBC 6.7 5.4  NEUTROABS 4.8  --   HGB 16.3* 10.9*  HCT 48.1* 32.1*  MCV 93.0 92.5  PLT 400 271   Basic Metabolic Panel:  Recent Labs Lab 06/16/17 1635 06/18/17 0553  NA 140 140  K 4.1 2.9*  CL 101 107  CO2 26 23  GLUCOSE 91 90  BUN 14 5*  CREATININE 0.56 0.41*  0.43*  CALCIUM 9.7 8.2*   Liver Function Tests:  Recent Labs Lab 06/16/17 1635  AST 27  ALT 16  ALKPHOS 63  BILITOT 1.1  PROT 7.6  ALBUMIN 4.4   Cardiac Enzymes:  Recent Labs Lab 06/16/17 1635  TROPONINI <0.03   Urine analysis:    Component Value Date/Time   COLORURINE AMBER (A) 06/16/2017 1635   APPEARANCEUR CLOUDY (A) 06/16/2017 1635   LABSPEC 1.030 06/16/2017 1635   PHURINE 5.0 06/16/2017 1635   GLUCOSEU NEGATIVE 06/16/2017 1635   HGBUR NEGATIVE 06/16/2017 1635   BILIRUBINUR SMALL (A) 06/16/2017 1635   KETONESUR 20 (A) 06/16/2017 1635  PROTEINUR 30 (A) 06/16/2017 1635   NITRITE NEGATIVE 06/16/2017 1635   LEUKOCYTESUR NEGATIVE 06/16/2017 1635   Recent Results (from the past 240 hour(s))  Blood Culture (routine x 2)     Status: None (Preliminary result)   Collection Time: 06/16/17  1:40 PM  Result Value Ref Range Status   Specimen Description BLOOD LEFT ARM  Final   Special Requests   Final    Blood Culture adequate volume BOTTLES DRAWN AEROBIC AND ANAEROBIC   Culture   Final    NO GROWTH < 24 HOURS Performed at [facility], [address], [place], [place] [address]    Report Status PENDING  Incomplete  Blood Culture (routine x 2)     Status: None (Preliminary result)   Collection Time: 06/16/17  5:00 PM  Result Value Ref Range Status   Specimen Description BLOOD LEFT FOREARM  Final   Special Requests   Final    Blood Culture adequate volume  BOTTLES DRAWN AEROBIC AND ANAEROBIC   Culture   Final    NO GROWTH < 24 HOURS Performed at [facility], [address] [place], [place] [address]    Report Status PENDING  Incomplete  Surgical PCR screen     Status: Abnormal   Collection Time: 06/17/17  2:49 PM  Result Value Ref Range Status   MRSA, PCR POSITIVE (A) NEGATIVE Final    Comment: RESULT CALLED TO, READ BACK BY AND VERIFIED WITH: REBECCA GORDON,RN [id number] @ 2039 BY J SCOTTON    Staphylococcus aureus POSITIVE (A) NEGATIVE Final    Comment: RESULT CALLED TO, READ BACK BY AND VERIFIED WITH: REBECCA GORDON,RN [id number] @ 2039 BY J SCOTTON (NOTE) The Xpert SA Assay (FDA approved for NASAL specimens in patients 93 years of age and older), is one component of a comprehensive surveillance program. It is not intended to diagnose infection nor to guide or monitor treatment.     Radiology Studies: Dg Chest 2 View  Result Date: 06/16/2017 CLINICAL DATA:  Mental status change. EXAM: CHEST  2 VIEW COMPARISON:  None. FINDINGS: The heart size is normal. The lungs are hyperinflated but clear. No pleural effusion or edema. Thoracic scoliosis identified. IMPRESSION: 1. No acute cardiopulmonary abnormalities. 2. Scoliosis. Electronically Signed   By: Signa Kell M.D.   On: 06/16/2017 17:57   Pelvis Portable  Result Date: 06/17/2017 CLINICAL DATA:  Right hip replacement. EXAM: PORTABLE PELVIS 1-2 VIEWS COMPARISON:  06/16/2017 FINDINGS: Interval placement of a unipolar right hip arthroplasty intact and normally located. Diffuse osteopenia. Mild degenerate change of the left hip. IMPRESSION: Right hip arthroplasty intact and normally located. Electronically Signed   By: Elberta Fortis M.D.   On: 06/17/2017 20:23   Dg Pelvis Portable  Result Date: 06/16/2017 CLINICAL DATA:  Right hip fracture noted on recent CT scan today. EXAM: PORTABLE PELVIS 1-2 VIEWS COMPARISON:  Plain films earlier today as well as CT earlier today. FINDINGS: Diffuse  osteopenia. Mild degenerative changes of the hips right worse than left. There is foreshortening of the right femoral neck as a discrete fracture line is not visualized. Degenerative change of the spine. Pelvic phleboliths. IMPRESSION: Foreshortening of the right femoral neck without definite fracture line visualized. Mild degenerative changes of the hips right worse than left. Electronically Signed   By: Elberta Fortis M.D.   On: 06/16/2017 23:16   Dg Abd 2 Views  Result Date: 06/16/2017 CLINICAL DATA:  Abdominal pain. EXAM: ABDOMEN - 2 VIEW COMPARISON:  None. FINDINGS: The bowel gas pattern is normal. Moderate stool burden identified within the colon. There is no evidence of free air. No radio-opaque calculi or other significant radiographic abnormality is seen. IMPRESSION: Nonobstructive bowel gas pattern. Moderate stool burden within the colon. Electronically Signed   By: Signa Kell M.D.   On: 06/16/2017 18:00   Ct Renal Stone Study  Result Date: 06/16/2017 CLINICAL DATA:  Altered mental status with possible UTI. EXAM: CT ABDOMEN AND PELVIS WITHOUT CONTRAST TECHNIQUE: Multidetector CT imaging of the abdomen and pelvis was performed following the standard protocol without IV contrast. COMPARISON:  None. FINDINGS: Lower chest: Minimal dependent atelectasis over the right base. Hepatobiliary: The liver and biliary tree are within normal. Possible sludge versus small stones within the dependent portion of the gallbladder. Pancreas: Pancreas somewhat difficult to visualize on this noncontrast study although no definite abnormality. Spleen: Within normal. Adrenals/Urinary Tract: Adrenal glands are normal. Kidneys are normal in size without hydronephrosis or nephrolithiasis. 1.1 cm cyst over the mid to upper pole left kidney with subcentimeter hyperdensity over the corticomedullary junction of the mid to upper pole left kidney with Hounsfield unit measurements of 99 likely early calcification. Ureters are  unremarkable. Tiny focus of air within the nondependent portion of the bladder compatible with known recent catheterization. Stomach/Bowel: The stomach and small bowel are within normal. Appendix is normal. There is diverticulosis of the colon with mild fecal retention throughout the colon. Vascular/Lymphatic: Minimal calcified plaque over the abdominal aorta. No significant adenopathy. Reproductive: Within normal. Other: No significant free fluid or focal inflammatory change. No free peritoneal air. Musculoskeletal: Moderate degenerate changes spine with curvature of the thoracolumbar spine convex left. Mild compression fracture of L4 age indeterminate. Mild degenerative change of the hips. Subcapital fracture of the right femoral neck likely subacute nature with minimal displacement. IMPRESSION: No acute findings in the abdomen/pelvis. Minimally displaced subcapital fracture of the right femoral neck likely subacute nature. Suggestion of mild gallbladder sludge versus stones. 1.1 cm left renal cyst with suggestion mild left focal nephrocalcinosis. Diverticulosis of the colon. Mild L4 compression fracture likely chronic. These results were called by telephone at the time of interpretation on 06/16/2017 at 8:33 pm to Dr. Jacalyn Lefevre , who verbally acknowledged these results. Electronically Signed   By: Elberta Fortis M.D.   On: 06/16/2017 20:33    Scheduled Meds: . Chlorhexidine Gluconate Cloth  6 each Topical Q0600  . docusate sodium  100 mg Oral BID  . enoxaparin (LOVENOX) injection  40 mg Subcutaneous Q24H  . mouth rinse  15 mL Mouth Rinse BID  . mupirocin ointment  1 application Nasal BID  . potassium chloride  20 mEq Oral BID  . verapamil  240 mg Oral Daily   Continuous Infusions: . sodium chloride 50 mL/hr at 06/18/17 0806  . methocarbamol (ROBAXIN)  IV    . potassium chloride 10 mEq (06/18/17 0806)     LOS: 2 days   Time spent: 25 minutes   Debbora Presto, MD [facility] Pager [phone]  If 7PM-7AM, please contact night-coverage [email or web address] Password [email or web address] 06/18/2017, 8:22 AM

## 2017-06-18 NOTE — Evaluation (Signed)
Physical Therapy Evaluation Patient Details Name: Sydney Welch MRN: [id number] DOB: [birth year] Today's Date: 06/18/2017   History of Present Illness  82 y.o. female with medical history significant of tachycardia and Alzheimer's with behavioral disturbance and admitted with R hip fracture, s/p R hip hemiarthroplasty 06/17/17  Clinical Impression  Patient is s/p above surgery resulting in functional limitations due to the deficits listed below (see PT Problem List).  Patient will benefit from skilled PT to increase their independence and safety with mobility to allow discharge to the venue listed below.   Pt requiring total assist at this time. Family was assisting at home due to advanced dementia.  Pt with posterior hip precautions.  Pt would benefit from SNF prior to d/c home as family requires more education and practice safely assisting pt.     Follow Up Recommendations SNF    Equipment Recommendations  None recommended by PT    Recommendations for Other Services       Precautions / Restrictions Precautions Precautions: Fall;Posterior Hip Precaution Comments: daughter reviewed and provided with handout on posterior hip precautions  Restrictions Other Position/Activity Restrictions: WBAT      Mobility  Bed Mobility Overal bed mobility: Needs Assistance Bed Mobility: Supine to Sit;Sit to Supine     Supine to sit: Max assist;HOB elevated Sit to supine: Total assist;+2 for physical assistance   General bed mobility comments: assist for upper and lower body, pain with movement limiting pt's ability to assist, total assist for return to supine and repositioning  Transfers                 General transfer comment: not attempted for safety  Ambulation/Gait                Stairs            Wheelchair Mobility    Modified Rankin (Stroke Patients Only)       Balance Overall balance assessment: History of Falls;Needs assistance Sitting-balance  support: Bilateral upper extremity supported;Feet supported Sitting balance-Leahy Scale: Zero                                       Pertinent Vitals/Pain Pain Assessment: Faces Faces Pain Scale: Hurts whole lot Pain Location: R hip Pain Descriptors / Indicators: Grimacing;Operative site guarding Pain Intervention(s): Ice applied;Repositioned;Limited activity within patient's tolerance (daughter requesting pain meds)    Home Living Family/patient expects to be discharged to:: Skilled nursing facility Living Arrangements: Children                    Prior Function Level of Independence: Needs assistance   Gait / Transfers Assistance Needed: transfers to w/c     Comments: assist due to severe dementia     Hand Dominance        Extremity/Trunk Assessment        Lower Extremity Assessment Lower Extremity Assessment: Generalized weakness;RLE deficits/detail RLE Deficits / Details: no active movement observed, pain with assisted mobility, maintained KI to assist with maintaining precautions       Communication   Communication:  (dementia limiting communication)  Cognition Arousal/Alertness: Lethargic Behavior During Therapy: Agitated (with movement, pain) Overall Cognitive Status: History of cognitive impairments - at baseline  General Comments      Exercises     Assessment/Plan    PT Assessment Patient needs continued PT services  PT Problem List Decreased strength;Decreased mobility;Decreased knowledge of use of DME;Decreased cognition;Decreased knowledge of precautions;Decreased activity tolerance;Decreased balance       PT Treatment Interventions Therapeutic exercise;DME instruction;Therapeutic activities;Functional mobility training;Balance training;Patient/family education;Wheelchair mobility training    PT Goals (Current goals can be found in the Care Plan section)  Acute  Rehab PT Goals PT Goal Formulation: With family Time For Goal Achievement: 07/02/17 Potential to Achieve Goals: Fair    Frequency Min 2X/week   Barriers to discharge        Co-evaluation               AM-PAC PT "6 Clicks" Daily Activity  Outcome Measure Difficulty turning over in bed (including adjusting bedclothes, sheets and blankets)?: Unable Difficulty moving from lying on back to sitting on the side of the bed? : Unable Difficulty sitting down on and standing up from a chair with arms (e.g., wheelchair, bedside commode, etc,.)?: Unable Help needed moving to and from a bed to chair (including a wheelchair)?: Total Help needed walking in hospital room?: Total Help needed climbing 3-5 steps with a railing? : Total 6 Click Score: 6    End of Session Equipment Utilized During Treatment: Right knee immobilizer Activity Tolerance: Patient limited by pain Patient left: in bed;with call bell/phone within reach;with family/visitor present;with bed alarm set Nurse Communication: Mobility status PT Visit Diagnosis: Other abnormalities of gait and mobility (R26.89)    Time: 1610-9604 PT Time Calculation (min) (ACUTE ONLY): 20 min   Charges:   PT Evaluation $PT Eval Low Complexity: 1 Low     PT G CodesZenovia Jarred, PT, DPT 06/18/2017 Pager: [phone]  Maida Sale E 06/18/2017, 12:57 PM

## 2017-06-18 NOTE — Progress Notes (Signed)
06/18/17 2230 Nursing Dr Toniann Fail called re K 2.9 from this morning. Patient not taling po pills. Order received for NS 40K at 50/h  Recheck bmet at [employer]

## 2017-06-18 NOTE — Progress Notes (Signed)
OT Cancellation Note  Patient Details Name: Sydney Welch MRN: [id number] DOB: Oct 08, 1934   Cancelled Treatment:    Reason Eval/Treat Not Completed: Other (comment). Pt continues to sleep and family reports that pt has insomnia at baseline. Will let her rest this pm and return in the am for OT evaluation.  Pt does not participate in ADLs at baseline; however, she does help with rolling to be changed and helps with transfers.  Kevonta Phariss 06/18/2017, 2:48 PM  Marica Otter, OTR/L [phone] 06/18/2017

## 2017-06-18 NOTE — Progress Notes (Signed)
OT Cancellation Note  Patient Details Name: LONNI DIRDEN MRN: [id number] DOB: 05-07-1935   Cancelled Treatment:    Reason Eval/Treat Not Completed: Other (comment).  Pt sleeping soundly; will reattempt evaluation later.  Luverne Zerkle 06/18/2017, 12:03 PM  Marica Otter, OTR/L [phone] 06/18/2017

## 2017-06-18 NOTE — Clinical Social Work Note (Signed)
Clinical Social Work Assessment  Patient Details  Name: Sydney Welch MRN: [id number] Date of Birth: 01/08/1935  Date of referral:  06/18/17               Reason for consult:  Facility Placement, Discharge Planning                Permission sought to share information with:  Case Manager, Magazine features editor, Family Supports Permission granted to share information::  Yes, Verbal Permission Granted  Name::        Agency::  [facility] [place]  Relationship::  Daughter, [place] Sydney Welch  Contact Information:     Housing/Transportation Living arrangements for the past 2 months:  Single Family Home Source of Information:  Medical Team, Case Manager, Adult Children Patient Interpreter Needed:  None Criminal Activity/Legal Involvement Pertinent to Current Situation/Hospitalization:  No - Comment as needed Significant Relationships:  Adult Children, Other Family Members, Siblings Lives with:  Adult Children (Lives with Sydney Welch) Do you feel safe going back to the place where you live?  No Need for family participation in patient care:  Yes (Comment) (POA)  Care giving concerns:  Patient admitted to [facility] after a fall at home. She is currently inpatient and seeking discharge planning assistance with possible short term rehab coverage. Per daughter, Sydney Welch. Reports in the last few months she has asked her sister for help (sister lives in [place]) in effort to care for patient.  Reports she had declined with multiple falls and reports she needs constant supervision.   Sydney Welch reports her memory has advanced and she cannot care for herself.  Sydney Welch reports prior level of function was independence with assistance of ADLS, bathing, dressing, and bathroom.  Reports she uses depends.  Patient was not using any assistance device prior to admission.  Daughter reports this is her first hospitalization in years as she did not get sick or go the doctor.  Daughter reports they are truly seeking long  term care, possibly memory care as patient has advanced.  Reports she can no longer manage patient at home and they have applied for Medicaid.  Specifically they were working to admit patient into [facility] program, however she was declined due to her memory issues. Medicaid currently has all paperwork and LCSW has educated daughter to assist with filing for special assistance medicaid for long term care.  Currently patient requiring only Short term rehab (payor source is [facility]) which allots 3 days at times with most amount 7-10 for rehab, with expectations of patient returning home.  In this case it is unclear if patient will be able too as she attempted to participate with therapy, but struggling to follow commands and meet goals per daughter who was in room at time of PT assessment. Family has also asked about having SLP evaluate as swallowing has been an issue per report.   Social Worker assessment / plan:  Assessment and consult completed with daughter. Patient unable to participate due to sleeping and memory deficits.  Plan currently is to refer to short term rehab (SNF work up completed) and submit for insurance authorization. LCSW educated family the possibility of insurance denial due to memory issues and working on alternative plans such as private pay or medicaid.  Family is interested in [facility] if insurance does provide coverage for SNF.  Due to traumatic fall and patient not at baseline, LCSW will anticipate patient getting authorized, but only for a short period of time.  Family voices  understanding.   Employment status:  Retired Database administrator PT Recommendations:  Skilled Nursing Facility Information / Referral to community resources:  Skilled Nursing Facility  Patient/Family's Response to care:  Understanding  Patient/Family's Understanding of and Emotional Response to Diagnosis, Current Treatment, and Prognosis:  Family voices concerns and  frustration as they report patient has worked all her life since she was 82 years old and feel her insurance should help take care of her.  Family given time to process emotions and also provided education regarding managed medicare companies and benefits.  Family has been proactive as they have a list in hand at time of assessment and been doing research in effort to support patient with discharge plan.  Emotional Assessment Appearance:  Appears stated age Attitude/Demeanor/Rapport:    Affect (typically observed):  Unable to Assess (Patient was soundly sleeping) Orientation:  Oriented to Self Alcohol / Substance use:  Not Applicable Psych involvement (Current and /or in the community):  No (Comment)  Discharge Needs  Concerns to be addressed:  Care Coordination, Coping/Stress Concerns, Financial / Insurance Concerns Readmission within the last 30 days:  No Current discharge risk:  Physical Impairment, Inadequate Financial Supports Barriers to Discharge:  Continued Medical Work up, [facility] [facility]   Raye Sorrow, LCSW 06/18/2017, 12:30 PM

## 2017-06-19 DIAGNOSIS — Z7189 Other specified counseling: Secondary | ICD-10-CM

## 2017-06-19 DIAGNOSIS — F0281 Dementia in other diseases classified elsewhere with behavioral disturbance: Secondary | ICD-10-CM

## 2017-06-19 DIAGNOSIS — S72001A Fracture of unspecified part of neck of right femur, initial encounter for closed fracture: Secondary | ICD-10-CM

## 2017-06-19 DIAGNOSIS — G309 Alzheimer's disease, unspecified: Secondary | ICD-10-CM

## 2017-06-19 DIAGNOSIS — Z515 Encounter for palliative care: Secondary | ICD-10-CM

## 2017-06-19 LAB — BASIC METABOLIC PANEL
Anion gap: 10 (ref 5–15)
CHLORIDE: 107 mmol/L (ref 101–111)
CO2: 23 mmol/L (ref 22–32)
CREATININE: 0.43 mg/dL — AB (ref 0.44–1.00)
Calcium: 8.2 mg/dL — ABNORMAL LOW (ref 8.9–10.3)
GFR calc Af Amer: >60 mL/min (ref >60)
GFR calc non Af Amer: >60 mL/min (ref >60)
GLUCOSE: 81 mg/dL (ref 65–99)
POTASSIUM: 3.4 mmol/L — AB (ref 3.5–5.1)
SODIUM: 140 mmol/L (ref 135–145)

## 2017-06-19 LAB — MAGNESIUM: Magnesium: 1.6 mg/dL — ABNORMAL LOW (ref 1.7–2.4)

## 2017-06-19 LAB — CBC
HEMATOCRIT: 32.4 % — AB (ref 36.0–46.0)
HEMOGLOBIN: 11 g/dL — AB (ref 12.0–15.0)
MCH: 31.3 pg (ref 26.0–34.0)
MCHC: 34 g/dL (ref 30.0–36.0)
MCV: 92.3 fL (ref 78.0–100.0)
Platelets: 270 10*3/uL (ref 150–400)
RBC: 3.51 MIL/uL — ABNORMAL LOW (ref 3.87–5.11)
RDW: 13.1 % (ref 11.5–15.5)
WBC: 7.5 10*3/uL (ref 4.0–10.5)

## 2017-06-19 MED ORDER — ENOXAPARIN SODIUM 40 MG/0.4ML ~~LOC~~ SOLN: 40.0000 mg | SUBCUTANEOUS | 0 refills | Status: DC

## 2017-06-19 MED ORDER — OXYCODONE HCL 20 MG/ML PO CONC
5.0000 mg | ORAL | Status: DC | PRN
  Administered 2017-06-19 – 2017-06-22 (×5): 5 mg via ORAL
  Filled 2017-06-19 (×7): qty 1

## 2017-06-19 MED ORDER — HYDROCODONE-ACETAMINOPHEN 5-325 MG PO TABS: 1.0000 | ORAL_TABLET | Freq: Four times a day (QID) | ORAL | 0 refills | Status: DC | PRN

## 2017-06-19 NOTE — Progress Notes (Addendum)
LCSW continues to follow for disposition/DC planning  LCSW met with [employer] at bedside, Provided bed offers and family is wanting [facility]. LCSW called [facility] and accepted on [facility].  [facility] reports they have no "planned" discharges until next week, however this changes daily and LCSW should check in daily for placement and beds. LCSW  Has other options if [facility] unable to accept.    Family is not anticipating discharge today and patient currently in [place] which prevents her from discharge (must be restraint free for 24 hours).  LCSW updated [facility] Medicare regarding bed choice and plan. Will send updated OT note and Speech Language note for continued authorization.  Lane Hacker, MSW Clinical Social Work: Printmaker Coverage for :  [phone]

## 2017-06-19 NOTE — Consult Note (Signed)
Consultation Note Date: 06/19/2017   Patient Name: Sydney Welch  DOB: 1934-09-26  MRN: [id number]  Age / Sex: 82 y.o., female  PCP: Merlene Laughter, MD Referring Physician: Osvaldo Shipper, MD  Reason for Consultation: Establishing goals of care  HPI/Patient Profile: 82 y.o. female    admitted on 06/16/2017     Clinical Assessment and Goals of Care:  82 year old lady with a past medical history significant for advanced Alzheimer's dementia, was living at home with her daughter who is her primary caregiver, base line of not being very mobile, not being very verbal, requiring soft foods at home, history of recurrent falls, presented after mechanical fall, right hip fracture, underwent right hip hemiarthroplasty 06-17-17, postoperative hospital course at times complicated by patient being in pain, somewhat agitated, to go to skilled nursing facility [facility], palliative consult for goals of care discussions.  Patient is elderly lady resting in bed. Her daughter Ms. Sydney Welch is at the bedside. I introduced myself and palliative care as follows: Palliative medicine is specialized medical care for people living with serious illness. It focuses on providing relief from the symptoms and stress of a serious illness. The goal is to improve quality of life for both the patient and the family.  Patient has 3 daughters, used to work as a [employer]er 2, has had gradual progressive decline as well as her dementia is concerned for the last few months to a year now, daughter Ms. Sydney Welch is the primary historian.  We discussed about dementia disease trajectory, functional status decline, cognitive decline, dysphagia. Goals wishes and values attempted to be discussed.  Patient's daughter would like for the patient to go to [facility], she states she has heard good things about the facility. She hopes that the  patient will recover to some extent possible. She states that the patient's baseline is such that she is nonverbal mostly nonambulatory and eats softer foods. We talked about having palliative care follow the patient at [facility], following her disease trajectory.  We frankly discussed that at some point, the patient will have to have hospice support. Discussed about hospice eligibility in light of advanced dementia in the near future. The patient's sister-in-law recently died at hospice of the [facility] hospice house a few months ago. Patient's daughter states family is well aware with hospice philosophy of care and will return for hospice support when they feel the time is appropriate.  See recommendations below. Thank you for the consult.  HCPOA  has 3 daughters Daughter Sydney Welch is HCPOA, primary caregiver, patient was living with her, prior to this hospitalization.   SUMMARY OF RECOMMENDATIONS    Agree with DO NOT RESUSCITATE.  Recommend skilled nursing facility with palliative take care to follow-up. Will need to determine hospice eligibility based on disease trajectory at the skilled nursing facility in the near future.  Continue speech language pathology recommended dietary precautions discharge pured food, nectar thick liquids.  We will discontinue Norco, patient unable to take pain medicine successfully, Norco was crushed  and given in applesauce, will switch to concentrated solution of oxycodone immediate release and monitor.  Dementia disease trajectory discussed in detail with daughter. All of her questions answered to the best of my ability.  Code Status/Advance Care Planning:  DNR    Symptom Management:    As above  Palliative Prophylaxis:   Delirium Protocol   Psycho-social/Spiritual:   Desire for further Chaplaincy support:yes  Additional Recommendations: Caregiving  Support/Resources  Prognosis:   Guarded   Discharge Planning: Skilled Nursing  Facility for rehab with Palliative care service follow-up      Primary Diagnoses: Present on Admission: . Hip fracture (HCC) . Dementia with behavioral disturbance . Tachycardia   I have reviewed the medical record, interviewed the patient and family, and examined the patient. The following aspects are pertinent.  Past Medical History:  Diagnosis Date  . Alzheimer disease   . Anxiety   . Tachycardia   . Vertigo    Social History   Social History  . Marital status: Married    Spouse name: N/A  . Number of children: N/A  . Years of education: N/A   Occupational History  . retired    Social History Main Topics  . Smoking status: Never Smoker  . Smokeless tobacco: Never Used  . Alcohol use No  . Drug use: No  . Sexual activity: Not Asked   Other Topics Concern  . None   Social History Narrative  . None   History reviewed. No pertinent family history. Scheduled Meds: . Chlorhexidine Gluconate Cloth  6 each Topical Q0600  . docusate sodium  100 mg Oral BID  . enoxaparin (LOVENOX) injection  40 mg Subcutaneous Q24H  . mouth rinse  15 mL Mouth Rinse BID  . mupirocin ointment  1 application Nasal BID  . verapamil  240 mg Oral Daily   Continuous Infusions: . 0.9 % NaCl with KCl 40 mEq / L 50 mL/hr (06/18/17 2238)  . methocarbamol (ROBAXIN)  IV     PRN Meds:.acetaminophen **OR** acetaminophen, alum & mag hydroxide-simeth, LORazepam, menthol-cetylpyridinium **OR** phenol, methocarbamol **OR** methocarbamol (ROBAXIN)  IV, metoCLOPramide **OR** metoCLOPramide (REGLAN) injection, morphine injection, ondansetron **OR** ondansetron (ZOFRAN) IV, oxyCODONE, polyethylene glycol Medications Prior to Admission:  Prior to Admission medications   Medication Sig Start Date End Date Taking? Authorizing Provider  ENSURE (ENSURE) Take 237 mLs by mouth daily.   Yes [provider]  LORazepam (ATIVAN) 1 MG tablet Take 1 mg by mouth 2 (two) times daily. 06/02/17  Yes [provider]  verapamil (CALAN-SR) 240 MG CR tablet Take 240 mg by mouth daily. 03/26/17  Yes [provider]  enoxaparin (LOVENOX) 40 MG/0.4ML injection Inject 0.4 mLs (40 mg total) into the skin daily. 06/19/17   Durene Romans, MD  HYDROcodone-acetaminophen (NORCO/VICODIN) 5-325 MG tablet Take 1-2 tablets by mouth every 6 (six) hours as needed for moderate pain. 06/19/17   Durene Romans, MD   Allergies  Allergen Reactions  . Caffeine    Review of Systems +pain  Physical Exam Elderly lady resting in bed Frail Eyes closed appears to have wincing/grimacing facial movements sometimes S1-S2 Clear breath sounds anterior lung fields No edema Patient recognizes her daughter, responds appropriately to daughter, moves all extremities resting in bed with eyes closed Nonverbal even at baseline most of the times  Vital Signs: BP (!) 140/109   Pulse (!) 102   Temp 98.3 F (36.8 C)   Resp 16   Ht  (1.575 m)  Wt 42.2 kg (93 lb)   SpO2 (!) 88%   BMI 17.01 kg/m  Pain Assessment: Faces POSS *See Group Information*: S-Acceptable,Sleep, easy to arouse Pain Score: Asleep   SpO2: SpO2: (!) 88 % O2 Device:SpO2: (!) 88 % O2 Flow Rate: .O2 Flow Rate (L/min): 2 L/min  IO: Intake/output summary:  Intake/Output Summary (Last 24 hours) at 06/19/17 1510 Last data filed at 06/19/17 0600  Gross per 24 hour  Intake              620 ml  Output              900 ml  Net             -280 ml   PPS 30% LBM: Last BM Date: 06/16/17 Baseline Weight: Weight: 42.2 kg (93 lb) Most recent weight: Weight: 42.2 kg (93 lb)     Palliative Assessment/Data:     Time In:  1400 Time Out:  1510 Time Total:   70 min  Greater than 50%  of this time was spent counseling and coordinating care related to the above assessment and plan.  Signed by: Rosalin Hawking, MD  [phone]  Please contact Palliative Medicine Team phone at [phone] for questions and concerns.  For individual provider: See  Loretha Stapler

## 2017-06-19 NOTE — Evaluation (Signed)
Occupational Therapy Evaluation Patient Details Name: Sydney Welch MRN: [id number] DOB: 01-10-1935 Today's Date: 06/19/2017    History of Present Illness 82 y.o. female with medical history significant of tachycardia and Alzheimer's with behavioral disturbance and admitted with R hip fracture, s/p R hip hemiarthroplasty 06/17/17   Clinical Impression   Pt was admitted for the above.  She had recent decline in mobility after hip fx; she has needed assistance with adls at baseline.  Pt would benefit from continued OT to improve mobility related to ADLs to decrease burden of care of family    Follow Up Recommendations  SNF    Equipment Recommendations  None recommended by OT    Recommendations for Other Services       Precautions / Restrictions Precautions Precautions: Fall;Posterior Hip Precaution Comments: reviewed precautions with daughter Restrictions Other Position/Activity Restrictions: WBAT      Mobility Bed Mobility     Rolling: Total assist (to L)   Supine to sit: Total assist;+2 for physical assistance Sit to supine: Total assist;+2 for physical assistance   General bed mobility comments: pt did not assist with bed mobility, but did not resist  Transfers                 General transfer comment: pt partially stood with hand held assistance +2; did not stand fully as pain increased.  She does not use AD at baseline    Balance   Sitting-balance support: Bilateral upper extremity supported Sitting balance-Leahy Scale: Poor (min guard for safety; min A for THPS)                                     ADL either performed or assessed with clinical judgement   ADL Overall ADL's : Needs assistance/impaired Eating/Feeding:  (pt has not been swallowing:  SLP pending)                                     General ADL Comments: total A for all ADLs, +2 for LB adls, hygiene to roll.  Pt assisted with bed mobility and transfers  at baseline     Vision         Perception     Praxis      Pertinent Vitals/Pain Pain Assessment: Faces Faces Pain Scale: Hurts even more Pain Location: R hip Pain Descriptors / Indicators: Grimacing Pain Intervention(s): Limited activity within patient's tolerance;Monitored during session;Ice applied     Hand Dominance     Extremity/Trunk Assessment Upper Extremity Assessment Upper Extremity Assessment: Difficult to assess due to impaired cognition (able to squeeze hands)           Communication Communication Communication: Receptive difficulties;Expressive difficulties (mostly non-verbal; "oh daddy")   Cognition Arousal/Alertness: Lethargic Behavior During Therapy:  (calm) Overall Cognitive Status: History of cognitive impairments - at baseline                                 General Comments: pt moved arms, squeezed hands, opened eyes briefly.  Daughter states pt usually keeps eyes closed most of the time   General Comments       Exercises     Shoulder Instructions      Home Living Family/patient expects to be discharged to:: Skilled nursing  facility Living Arrangements: Children                                      Prior Functioning/Environment Level of Independence: Needs assistance        Comments: total assist wtih adls. Pt helped with transfers and rolling to change depends until recently (had undiagnosed hip fx)        OT Problem List: Decreased strength;Decreased activity tolerance;Impaired balance (sitting and/or standing);Decreased cognition;Decreased safety awareness;Pain      OT Treatment/Interventions: Balance training;Patient/family education;Therapeutic activities    OT Goals(Current goals can be found in the care plan section) Acute Rehab OT Goals Patient Stated Goal: family's:  rehab to get back to assisting wtih rolling/transfers OT Goal Formulation: With family Time For Goal Achievement:  06/26/17 Potential to Achieve Goals: Fair ADL Goals Additional ADL Goal #1: pt will roll to L side with max A to assist with hygiene/depends garment change Additional ADL Goal #2: pt will go from sit to stand with max A +2 and maintain for 1 minute for adls with mod A  OT Frequency: Min 2X/week   Barriers to D/C:            Co-evaluation              AM-PAC PT "6 Clicks" Daily Activity     Outcome Measure Help from another person eating meals?: Total Help from another person taking care of personal grooming?: Total Help from another person toileting, which includes using toliet, bedpan, or urinal?: Total Help from another person bathing (including washing, rinsing, drying)?: Total Help from another person to put on and taking off regular upper body clothing?: Total Help from another person to put on and taking off regular lower body clothing?: Total 6 Click Score: 6   End of Session    Activity Tolerance: Patient tolerated treatment well Patient left: in bed;with bed alarm set (unable to use call bell)  OT Visit Diagnosis: Unsteadiness on feet (R26.81);Muscle weakness (generalized) (M62.81);Pain Pain - Right/Left: Right Pain - part of body: Hip                Time: 0454-0981 OT Time Calculation (min): 28 min Charges:  OT General Charges $OT Visit: 1 Visit OT Treatments $Therapeutic Activity: 8-22 mins G-Codes:     Sydney Welch, Sydney Welch [phone] 06/19/2017  Sydney Welch 06/19/2017, 9:17 AM

## 2017-06-19 NOTE — Progress Notes (Signed)
[facility] PROGRESS NOTE  Sydney Welch ZOX:[id number] DOB: 1935/01/02 DOA: 06/16/2017  PCP: Merlene Laughter, MD  Brief History/Interval Summary: 82 year old Caucasian female with a past medical history of advanced Alzheimer's dementia, presented after a mechanical fall and was found to have right hip fracture. She was hospitalized for further management.  Reason for Visit: Right hip fracture  Consultants: Orthopedics  Procedures: Status post right hip hemi-arthroplasty 10/1  Antibiotics: None  Subjective/Interval History: Patient moaning and groaning this morning. Eyes closed. Her daughter is at the bedside. No information available from the patient.  ROS: Unable to do due to her dementia  Objective:  Vital Signs  Vitals:   06/18/17 1447 06/18/17 2142 06/19/17 0409 06/19/17 1155  BP: 139/78 123/86 (!) 145/82 (!) 140/109  Pulse: 89 97 90 (!) 102  Resp: Temp: 98.1 F (36.7 C) 98.1 F (36.7 C) 98.3 F (36.8 C)   TempSrc: Axillary Axillary    SpO2: 98%  (!) 88%   Weight:      Height:        Intake/Output Summary (Last 24 hours) at 06/19/17 1259 Last data filed at 06/19/17 0600  Gross per 24 hour  Intake              620 ml  Output             1075 ml  Net             -455 ml   Filed Weights   06/18/17 0515  Weight: 42.2 kg (93 lb)    General appearance: appears stated age, delirious, distracted and no distress Head: Normocephalic, without obvious abnormality, atraumatic Resp: clear to auscultation bilaterally Cardio: regular rate and rhythm, S1, S2 normal, no murmur, click, rub or gallop GI: soft, non-tender; bowel sounds normal; no masses,  no organomegaly Pulses: 2+ and symmetric Neurologic: Patient with eyes closed. Moving all her extremities. No obvious focal neurological deficits.  Lab Results:  Data Reviewed: I have personally reviewed following labs and imaging studies  CBC:  Recent Labs Lab 06/16/17 1635 06/18/17 0553  06/19/17 0300  WBC 6.7 5.4 7.5  NEUTROABS 4.8  --   --   HGB 16.3* 10.9* 11.0*  HCT 48.1* 32.1* 32.4*  MCV 93.0 92.5 92.3  PLT 400 271 270    Basic Metabolic Panel:  Recent Labs Lab 06/16/17 1635 06/18/17 0553 06/19/17 0300  NA 140 140 140  K 4.1 2.9* 3.4*  CL 101 107 107  CO2 GLUCOSE 91 90 81  BUN 14 5* <5*  CREATININE 0.56 0.41*  0.43* 0.43*  CALCIUM 9.7 8.2* 8.2*  MG  --   --  1.6*    GFR: Estimated Creatinine Clearance: 37.4 mL/min (A) (by C-G formula based on SCr of 0.43 mg/dL (L)).  Liver Function Tests:  Recent Labs Lab 06/16/17 1635  AST 27  ALT 16  ALKPHOS 63  BILITOT 1.1  PROT 7.6  ALBUMIN 4.4    Cardiac Enzymes:  Recent Labs Lab 06/16/17 1635  TROPONINI <0.03     Recent Results (from the past 240 hour(s))  Blood Culture (routine x 2)     Status: None (Preliminary result)   Collection Time: 06/16/17  1:40 PM  Result Value Ref Range Status   Specimen Description BLOOD LEFT ARM  Final   Special Requests   Final    Blood Culture adequate volume BOTTLES DRAWN AEROBIC AND ANAEROBIC   Culture  Final    NO GROWTH 2 DAYS Performed at [facility], [address], [place], [place] [address]    Report Status PENDING  Incomplete  Blood Culture (routine x 2)     Status: None (Preliminary result)   Collection Time: 06/16/17  5:00 PM  Result Value Ref Range Status   Specimen Description BLOOD LEFT FOREARM  Final   Special Requests   Final    Blood Culture adequate volume BOTTLES DRAWN AEROBIC AND ANAEROBIC   Culture   Final    NO GROWTH 2 DAYS Performed at [facility], [address] [place], [place] [address]    Report Status PENDING  Incomplete  Surgical PCR screen     Status: Abnormal   Collection Time: 06/17/17  2:49 PM  Result Value Ref Range Status   MRSA, PCR POSITIVE (A) NEGATIVE Final    Comment: RESULT CALLED TO, READ BACK BY AND VERIFIED WITH: REBECCA GORDON,RN [id number] @ 2039 BY J SCOTTON     Staphylococcus aureus POSITIVE (A) NEGATIVE Final    Comment: RESULT CALLED TO, READ BACK BY AND VERIFIED WITH: REBECCA GORDON,RN [id number] @ 2039 BY J SCOTTON (NOTE) The Xpert SA Assay (FDA approved for NASAL specimens in patients 65 years of age and older), is one component of a comprehensive surveillance program. It is not intended to diagnose infection nor to guide or monitor treatment.       Radiology Studies: Pelvis Portable  Result Date: 06/17/2017 CLINICAL DATA:  Right hip replacement. EXAM: PORTABLE PELVIS 1-2 VIEWS COMPARISON:  06/16/2017 FINDINGS: Interval placement of a unipolar right hip arthroplasty intact and normally located. Diffuse osteopenia. Mild degenerate change of the left hip. IMPRESSION: Right hip arthroplasty intact and normally located. Electronically Signed   By: Elberta Fortis M.D.   On: 06/17/2017 20:23     Medications:  Scheduled: . Chlorhexidine Gluconate Cloth  6 each Topical Q0600  . docusate sodium  100 mg Oral BID  . enoxaparin (LOVENOX) injection  40 mg Subcutaneous Q24H  . mouth rinse  15 mL Mouth Rinse BID  . mupirocin ointment  1 application Nasal BID  . potassium chloride  20 mEq Oral BID  . verapamil  240 mg Oral Daily   Continuous: . sodium chloride 50 mL/hr at 06/18/17 0806  . 0.9 % NaCl with KCl 40 mEq / L 50 mL/hr (06/18/17 2238)  . methocarbamol (ROBAXIN)  IV     JYN:[id number] **OR** acetaminophen, alum & mag hydroxide-simeth, HYDROcodone-acetaminophen, LORazepam, menthol-cetylpyridinium **OR** phenol, methocarbamol **OR** methocarbamol (ROBAXIN)  IV, metoCLOPramide **OR** metoCLOPramide (REGLAN) injection, morphine injection, ondansetron **OR** ondansetron (ZOFRAN) IV, polyethylene glycol  Assessment/Plan:  Principal Problem:   Hip fracture (HCC) Active Problems:   Dementia with behavioral disturbance   Tachycardia    Right hip fracture Patient is status post right hemiarthroplasty. She appears to be medically stable.  Appears to be in pain and discomfort. Continue pain medications. Patient's daughter was at the bedside and was very concerned about patient's poorly controlled pain.  Advanced dementia. Patient's daughter mentioned that patient has advanced which and has had difficulty swallowing for a long period of time. Over the last 5 weeks she has declined significantly with multiple falls at home. Her oral intake has been poor due to her advancing dementia. Based on this information it appears that the patient is approaching end of life. This was briefly discussed with the patient's daughter. She is amenable to having a conversation with palliative medicine for goals of  care. Patient could be a candidate for hospice. Palliative medicine has been consulted.  Dysphagia. Secondary to dementia. Speech therapy has been consulted.  History of essential hypertension and tachycardia. She is noted to be on verapamil at home. However, patient has not been cooperative with oral intake in the hospital. Continue to monitor for now.  Hypokalemia. This is being repleted intravenously. Potassium level is better today.  DVT Prophylaxis: Lovenox  Code Status: CODE STATUS discussed with the daughter. She is a DO NOT RESUSCITATE. Family Communication: Discussed with daughter  Disposition Plan: Await palliative medicine input for goals of care and to assist with disposition. Main goal is to keep the patient comfortable.    LOS: 3 days   [facility]  [facility] Pager [phone] 06/19/2017, 12:59 PM  If 7PM-7AM, please contact night-coverage at [email or web address], password [facility]

## 2017-06-19 NOTE — Care Management Important Message (Addendum)
Important Message  Patient Details IM Letter given to Nora/Case Manager to present to Patient  Name: Sydney Welch MRN: [id number] Date of Birth: July 03, 1935   Medicare Important Message Given:  Yes    Caren Macadam 06/19/2017, 10:46 AMImportant Message  Patient Details  Name: Sydney Welch MRN: [id number] Date of Birth: 1935/04/06   Medicare Important Message Given:  Yes    Caren Macadam 06/19/2017, 10:46 AM

## 2017-06-19 NOTE — Progress Notes (Signed)
Patient ID: Sydney Welch, female   DOB: September 06, 1935, 82 y.o.   MRN: [id number] Subjective: 2 Days Post-Op Procedure(s) (LRB): ARTHROPLASTY BIPOLAR HIP (HEMIARTHROPLASTY) (Right)    Patient demented.  Sleepy well. No events  Objective:   VITALS:   Vitals:   06/18/17 2142 06/19/17 0409  BP: 123/86 (!) 145/82  Pulse: 97 90  Resp: 17 16  Temp: 98.1 F (36.7 C) 98.3 F (36.8 C)  SpO2:  (!) 88%    Neurovascular intact Incision: dressing C/D/I  LABS  Recent Labs  06/16/17 1635 06/18/17 0553 06/19/17 0300  HGB 16.3* 10.9* 11.0*  HCT 48.1* 32.1* 32.4*  WBC 6.7 5.4 7.5  PLT 400 271 270     Recent Labs  06/16/17 1635 06/18/17 0553 06/19/17 0300  NA 140 140 140  K 4.1 2.9* 3.4*  BUN 14 5* <5*  CREATININE 0.56 0.41*  0.43* 0.43*  GLUCOSE 91 90 81    No results for input(s): LABPT, INR in the last 72 hours.   Assessment/Plan: 2 Days Post-Op Procedure(s) (LRB): ARTHROPLASTY BIPOLAR HIP (HEMIARTHROPLASTY) (Right)   Up with therapy  RTC in 2 weeks Norco for pain Lovenox for DVT prophylaxis for 10 days post op WBAT RLE

## 2017-06-19 NOTE — Progress Notes (Signed)
Dr. Rito Ehrlich notified of BP and HR. No new orders given.

## 2017-06-19 NOTE — Evaluation (Signed)
Clinical/Bedside Swallow Evaluation Patient Details  Name: SAROYA RICCOBONO MRN: [id number] Date of Birth: [birth year]  Today's Date: 06/19/2017 Time: SLP Start Time (ACUTE ONLY): 1045 SLP Stop Time (ACUTE ONLY): 1145 SLP Time Calculation (min) (ACUTE ONLY): 60 min  Past Medical History:  Past Medical History:  Diagnosis Date  . Alzheimer disease   . Anxiety   . Tachycardia   . Vertigo    Past Surgical History:  Past Surgical History:  Procedure Laterality Date  . HIP ARTHROPLASTY Right 06/17/2017   Procedure: ARTHROPLASTY BIPOLAR HIP (HEMIARTHROPLASTY);  Surgeon: Paralee Cancel, MD;  Location: [facility];  Service: Orthopedics;  Laterality: Right;   HPI:  82 year old female admitted 9/30 after sustaining right hip fracture due to fall. PMH significant for tachycardia, advanced dementia, vertigo, multiple falls. Pt underwent ORIF 06/17/17.  BSE requested to determine least restrictive diet.    Assessment / Plan / Recommendation Clinical Impression  Pt was lethargic, but able to participate in evaluation. Unable to consistently follow commands. Generalized weakness noted. No overt s/s aspiration observed on any consistency presented, however, pt had difficulty with thin liquids - unable to drink from a straw, poor lip closure for cup use, and minimal intake via spoon. For this reason, pt may be given nectar thick liquids to facilitate adequate hydration. Pt tolerated puree and softened graham cracker without overt difficulty, however, will continue with puree diet for now, for energy conservation and due to lack of dentition (dentures not available).   Lengthy discussion with pt's family regarding dysphagia and advanced dementia. Son in law indicates they do not want to pursue PEG tube placement. Safe swallow precautions posted at [facility], including information regarding continuing po intake with known aspiration risk. Pt has not met her nutrition/hydration needs for quite some time, which may  continue to be the case. Her daughters indicate pt's status has declined over the past year, with more rapid deterioration over the last month. Pt has demonstrated significant decline in po intake at home.  ST will follow for assessment of diet tolerance and education. RN and family informed of results and recommendations.  SLP Visit Diagnosis: Dysphagia, unspecified (R13.10)    Aspiration Risk  Moderate aspiration risk    Diet Recommendation Thin liquid;Nectar-thick liquid;Dysphagia 1 (Puree)   Liquid Administration via: Cup;Spoon Medication Administration: Crushed with puree Supervision: Full supervision/cueing for compensatory strategies;Staff to assist with self feeding Compensations: Minimize environmental distractions;Slow rate;Small sips/bites Postural Changes: Seated upright at 90 degrees;Remain upright for at least 30 minutes after po intake    Other  Recommendations Oral Care Recommendations: Oral care BID Other Recommendations: Have oral suction available   Follow up Recommendations 24 hour supervision/assistance;Skilled Nursing facility      Frequency and Duration min 2x/week  1 week;2 weeks       Prognosis Prognosis for Safe Diet Advancement: Guarded Barriers to Reach Goals: Cognitive deficits      Swallow Study   General Date of Onset: 06/16/17 HPI: 82 year old female admitted 9/30 after sustaining right hip fracture due to fall. PMH significant for tachycardia, advanced dementia, vertigo, multiple falls. Pt underwent ORIF 06/17/17.  BSE requested to determine least restrictive diet.  Type of Study: Bedside Swallow Evaluation Previous Swallow Assessment: none found Diet Prior to this Study: Regular;Thin liquids Temperature Spikes Noted: No Respiratory Status: Nasal cannula History of Recent Intubation: No Behavior/Cognition: Confused;Lethargic/Drowsy;Distractible;Requires cueing;Doesn't follow directions Oral Cavity Assessment: Within Functional Limits Oral  Care Completed by SLP: Yes Oral Cavity - Dentition: Missing dentition  Vision:  (kept eyes closed throughout evaluation) Self-Feeding Abilities: Total assist Patient Positioning: Upright in bed Baseline Vocal Quality: Low vocal intensity Volitional Cough: Cognitively unable to elicit Volitional Swallow: Unable to elicit    Oral/Motor/Sensory Function Overall Oral Motor/Sensory Function: Mild impairment (generalized weakness)   Ice Chips Ice chips: Within functional limits   Thin Liquid Thin Liquid: Within functional limits Presentation: Cup;Spoon (unable to drink from straw)    Nectar Thick Nectar Thick Liquid: Within functional limits Presentation: Cup;Spoon   Honey Thick Honey Thick Liquid: Not tested   Puree Puree: Within functional limits Presentation: Spoon   Solid   GO   Solid: Within functional limits (softened graham cracker) Presentation: Spoon       Celia B. Quentin Ore [facility], [place] Speech Language Pathologist [phone]  Shonna Chock 06/19/2017,12:08 PM

## 2017-06-20 DIAGNOSIS — R509 Fever, unspecified: Secondary | ICD-10-CM

## 2017-06-20 DIAGNOSIS — D649 Anemia, unspecified: Secondary | ICD-10-CM

## 2017-06-20 LAB — URINALYSIS, ROUTINE W REFLEX MICROSCOPIC
Bilirubin Urine: NEGATIVE
Glucose, UA: NEGATIVE mg/dL
Hgb urine dipstick: NEGATIVE
KETONES UR: 80 mg/dL — AB
Leukocytes, UA: NEGATIVE
Nitrite: NEGATIVE
PH: 5 (ref 5.0–8.0)
PROTEIN: 30 mg/dL — AB
Specific Gravity, Urine: 1.02 (ref 1.005–1.030)
WBC, UA: NONE SEEN WBC/hpf (ref 0–5)

## 2017-06-20 LAB — BASIC METABOLIC PANEL
Anion gap: 10 (ref 5–15)
BUN: 7 mg/dL (ref 6–20)
CO2: 22 mmol/L (ref 22–32)
CREATININE: 0.47 mg/dL (ref 0.44–1.00)
Calcium: 8.3 mg/dL — ABNORMAL LOW (ref 8.9–10.3)
Chloride: 113 mmol/L — ABNORMAL HIGH (ref 101–111)
GFR calc Af Amer: >60 mL/min (ref >60)
GLUCOSE: 130 mg/dL — AB (ref 65–99)
POTASSIUM: 4.4 mmol/L (ref 3.5–5.1)
SODIUM: 145 mmol/L (ref 135–145)

## 2017-06-20 LAB — CBC
HCT: 23.2 % — ABNORMAL LOW (ref 36.0–46.0)
Hemoglobin: 8 g/dL — ABNORMAL LOW (ref 12.0–15.0)
MCH: 32 pg (ref 26.0–34.0)
MCHC: 34.5 g/dL (ref 30.0–36.0)
MCV: 92.8 fL (ref 78.0–100.0)
PLATELETS: 264 10*3/uL (ref 150–400)
RBC: 2.5 MIL/uL — ABNORMAL LOW (ref 3.87–5.11)
RDW: 13.3 % (ref 11.5–15.5)
WBC: 8.4 10*3/uL (ref 4.0–10.5)

## 2017-06-20 MED ORDER — MAGNESIUM SULFATE 2 GM/50ML IV SOLN
2.0000 g | Freq: Once | INTRAVENOUS | Status: AC
Start: 2017-06-20 — End: 2017-06-20
  Administered 2017-06-20: 2 g via INTRAVENOUS
  Filled 2017-06-20: qty 50

## 2017-06-20 NOTE — Progress Notes (Signed)
LCSW continues to follow for DC planning Updated Clinicals sent to [facility] today.    Bed chosen at [facility] Authorization obtained by [facility] Medicare:  [phone] Affective tomorrow 06/21/17 This authorization is good through 10/7 If member does not discharge over the weekend, LCSW is to call [facility] and make them aware to extend the auth or cancel on Monday. Call:  [phone]  [phone] Patient has been approved at a RUG LEVEL:  2HB 493 therapy minutes.    LCSW to follow up and continue to assist with discharge planning.  Deretha Emory, MSW Clinical Social Work: Optician, dispensing Coverage for :  [phone]

## 2017-06-20 NOTE — Progress Notes (Signed)
Occupational Therapy Treatment Patient Details Name: Sydney Welch MRN: [id number] DOB: 1935-02-13 Today's Date: 06/20/2017    History of present illness 82 y.o. female with medical history significant of tachycardia and Alzheimer's with behavioral disturbance and admitted with R hip fracture, s/p R hip hemiarthroplasty 06/17/17   OT comments  Able to stand (with trunk flexed) with +2 assistance today  Follow Up Recommendations  SNF    Equipment Recommendations  None recommended by OT    Recommendations for Other Services      Precautions / Restrictions Precautions Precautions: Fall;Posterior Hip Restrictions Weight Bearing Restrictions: No Other Position/Activity Restrictions: WBAT       Mobility Bed Mobility Overal bed mobility: Needs Assistance Bed Mobility: Supine to Sit;Sit to Supine     Supine to sit: Total assist;+2 for physical assistance Sit to supine: Total assist;+2 for physical assistance   General bed mobility comments: pt not actively assist with movement, no resistance  Transfers Overall transfer level: Needs assistance Equipment used: 2 person hand held assist Transfers: Sit to/from Stand Sit to Stand: Total assist;+2 physical assistance         General transfer comment: assist to stand utilizing bed pad and blocking L knee, maintained KI, performed twice. Did not stand fully    Balance Overall balance assessment: Needs assistance Sitting-balance support: Bilateral upper extremity supported Sitting balance-Leahy Scale: Poor Sitting balance - Comments: external support required                                   ADL either performed or assessed with clinical judgement   ADL                                         General ADL Comments: performed bed mobility and stood x 2 (but not fully) with goals of decreasing burden of care; pt needs total A for adls     Vision       Perception     Praxis       Cognition Arousal/Alertness: Lethargic Behavior During Therapy:  (eyes closed, minimal facial expressions) Overall Cognitive Status: History of cognitive impairments - at baseline                                 General Comments: eyes closed; not resistive        Exercises     Shoulder Instructions       General Comments      Pertinent Vitals/ Pain       Pain Assessment: Faces Faces Pain Scale: Hurts little more Pain Location: R hip Pain Descriptors / Indicators: Grimacing (only when returning to supine) Pain Intervention(s): Limited activity within patient's tolerance;Monitored during session;Premedicated before session;Repositioned  Home Living                                          Prior Functioning/Environment              Frequency           Progress Toward Goals  OT Goals(current goals can now be found in the care plan section)  Progress towards OT goals:  (pt not  resisting but not assisting at this time)     Plan      Co-evaluation    PT/OT/SLP Co-Evaluation/Treatment: Yes Reason for Co-Treatment: For patient/therapist safety;Complexity of the patient's impairments (multi-system involvement) PT goals addressed during session: Mobility/safety with mobility OT goals addressed during session: Strengthening/ROM      AM-PAC PT "6 Clicks" Daily Activity     Outcome Measure   Help from another person eating meals?: Total Help from another person taking care of personal grooming?: Total Help from another person toileting, which includes using toliet, bedpan, or urinal?: Total Help from another person bathing (including washing, rinsing, drying)?: Total Help from another person to put on and taking off regular upper body clothing?: Total Help from another person to put on and taking off regular lower body clothing?: Total 6 Click Score: 6    End of Session    OT Visit Diagnosis: Unsteadiness on feet  (R26.81);Muscle weakness (generalized) (M62.81);Pain Pain - Right/Left: Right Pain - part of body: Hip   Activity Tolerance Patient tolerated treatment well   Patient Left in bed;with bed alarm set   Nurse Communication          Time: 1610-9604 OT Time Calculation (min): 21 min  Charges: OT General Charges $OT Visit: 1 Visit OT Treatments $Therapeutic Activity: 8-22 mins  Marica Otter, OTR/L [phone] 06/20/2017   Sydney Welch 06/20/2017, 12:32 PM

## 2017-06-20 NOTE — Consult Note (Signed)
SLP Consult Note  Patient Details Name: Sydney Welch MRN: [id number] DOB: 03-03-1935   SLP discussed pt status with pt's daughter, who had been present during BSE yesterday. Per daughter, pt has had more pain today, and has not eaten more than a few bites of puree after meds. Pt currently lethargic and insufficiently arousable for po trials. SLP provided opportunity to ask questions from yesterday's evaluation. Daughter verbalized understanding of safe swallow precautions and process for oral care. ST will follow for diet tolerance assessment and education.   [name], CCC-SLP Speech Language Pathologist [phone]  Leigh Aurora 06/20/2017, 3:21 PM

## 2017-06-20 NOTE — Progress Notes (Signed)
[facility] PROGRESS NOTE  COHEN DOLEMAN OZH:[id number] DOB: 1935-08-11 DOA: 06/16/2017  PCP: Merlene Laughter, MD  Brief History/Interval Summary: 82 year old Caucasian female with a past medical history of advanced Alzheimer's dementia, presented after a mechanical fall and was found to have right hip fracture. She was hospitalized for further management.  Reason for Visit: Right hip fracture  Consultants: Orthopedics. Palliative medicine.  Procedures: Status post right hip hemi-arthroplasty 10/1  Antibiotics: None  Subjective/Interval History: Patient appears much more comfortable this morning compared to yesterday. Does not open her eyes. Remains nonverbal for the most part. Her daughter is at the bedside.   ROS: Unable to do due to her dementia  Objective:  Vital Signs  Vitals:   06/19/17 1155 06/19/17 1710 06/19/17 2149 06/20/17 0500  BP: (!) 140/109 (!) 151/75 (!) 144/85 128/87  Pulse: (!) 102 96 65 89  Resp:  Temp:  99.1 F (37.3 C) (!) 100.6 F (38.1 C) 99.7 F (37.6 C)  TempSrc:  Axillary Axillary Oral  SpO2:  96% 100% 100%  Weight:      Height:        Intake/Output Summary (Last 24 hours) at 06/20/17 0917 Last data filed at 06/20/17 0559  Gross per 24 hour  Intake              660 ml  Output              900 ml  Net             -240 ml   Filed Weights   06/18/17 0515  Weight: 42.2 kg (93 lb)    General appearance: Lying down with eyes closed. Does not communicate. Resp: Clear to auscultation bilaterally Cardio: S1, S2 is normal, regular. No S3, S4. No rubs, murmurs, or bruit GI: Abdomen is soft. Nontender, nondistended. Bowel sounds present. No masses or organomegaly Neurologic: Patient with eyes closed. Moving all her extremities. No obvious focal neurological deficits.  Lab Results:  Data Reviewed: I have personally reviewed following labs and imaging studies  CBC:  Recent Labs Lab 06/16/17 1635 06/18/17 0553  06/19/17 0300 06/20/17 0456  WBC 6.7 5.4 7.5 8.4  NEUTROABS 4.8  --   --   --   HGB 16.3* 10.9* 11.0* 8.0*  HCT 48.1* 32.1* 32.4* 23.2*  MCV 93.0 92.5 92.3 92.8  PLT 400 271 270 264    Basic Metabolic Panel:  Recent Labs Lab 06/16/17 1635 06/18/17 0553 06/19/17 0300 06/20/17 0456  NA 140 140 140 145  K 4.1 2.9* 3.4* 4.4  CL 101 107 107 113*  CO2 GLUCOSE 91 90 81 130*  BUN 14 5* <5* 7  CREATININE 0.56 0.41*  0.43* 0.43* 0.47  CALCIUM 9.7 8.2* 8.2* 8.3*  MG  --   --  1.6*  --     GFR: Estimated Creatinine Clearance: 37.4 mL/min (by C-G formula based on SCr of 0.47 mg/dL).  Liver Function Tests:  Recent Labs Lab 06/16/17 1635  AST 27  ALT 16  ALKPHOS 63  BILITOT 1.1  PROT 7.6  ALBUMIN 4.4    Cardiac Enzymes:  Recent Labs Lab 06/16/17 1635  TROPONINI <0.03     Recent Results (from the past 240 hour(s))  Blood Culture (routine x 2)     Status: None (Preliminary result)   Collection Time: 06/16/17  1:40 PM  Result Value Ref Range Status   Specimen Description BLOOD LEFT ARM  Final   Special Requests   Final    Blood Culture adequate volume BOTTLES DRAWN AEROBIC AND ANAEROBIC   Culture   Final    NO GROWTH 3 DAYS Performed at [facility], [address], [place], [place] [address]    Report Status PENDING  Incomplete  Blood Culture (routine x 2)     Status: None (Preliminary result)   Collection Time: 06/16/17  5:00 PM  Result Value Ref Range Status   Specimen Description BLOOD LEFT FOREARM  Final   Special Requests   Final    Blood Culture adequate volume BOTTLES DRAWN AEROBIC AND ANAEROBIC   Culture   Final    NO GROWTH 3 DAYS Performed at [facility], [address], [place], [place] [address]    Report Status PENDING  Incomplete  Surgical PCR screen     Status: Abnormal   Collection Time: 06/17/17  2:49 PM  Result Value Ref Range Status   MRSA, PCR POSITIVE (A) NEGATIVE Final    Comment: RESULT CALLED TO, READ  BACK BY AND VERIFIED WITH: REBECCA GORDON,RN [id number] @ 2039 BY J SCOTTON    Staphylococcus aureus POSITIVE (A) NEGATIVE Final    Comment: RESULT CALLED TO, READ BACK BY AND VERIFIED WITH: REBECCA GORDON,RN [id number] @ 2039 BY J SCOTTON (NOTE) The Xpert SA Assay (FDA approved for NASAL specimens in patients 7 years of age and older), is one component of a comprehensive surveillance program. It is not intended to diagnose infection nor to guide or monitor treatment.       Radiology Studies: No results found.   Medications:  Scheduled: . Chlorhexidine Gluconate Cloth  6 each Topical Q0600  . docusate sodium  100 mg Oral BID  . enoxaparin (LOVENOX) injection  40 mg Subcutaneous Q24H  . mouth rinse  15 mL Mouth Rinse BID  . mupirocin ointment  1 application Nasal BID  . verapamil  240 mg Oral Daily   Continuous: . 0.9 % NaCl with KCl 40 mEq / L 50 mL/hr (06/19/17 1657)  . methocarbamol (ROBAXIN)  IV     JYN:[id number] **OR** acetaminophen, alum & mag hydroxide-simeth, LORazepam, menthol-cetylpyridinium **OR** phenol, methocarbamol **OR** methocarbamol (ROBAXIN)  IV, metoCLOPramide **OR** metoCLOPramide (REGLAN) injection, morphine injection, ondansetron **OR** ondansetron (ZOFRAN) IV, oxyCODONE, polyethylene glycol  Assessment/Plan:  Principal Problem:   Hip fracture (HCC) Active Problems:   Dementia with behavioral disturbance   Tachycardia   Closed fracture of neck of right femur (HCC)   Encounter for palliative care   Goals of care, counseling/discussion    Right hip fracture Patient is status post right hemiarthroplasty. She appears to be medically stable. Pain appears to be better controlled on current regimen. Appreciate palliative medicine assistance with this.   Advanced dementia. Patient's daughter mentioned that patient has advanced which and has had difficulty swallowing for a long period of time. Over the last 5 weeks she has declined significantly with  multiple falls at home. Her oral intake has been poor due to her advancing dementia. Based on this information it appears that the patient is declining and could be approaching end of life. Palliative medicine was consulted. They recommend placement to skilled nursing facility with palliative medicine to follow. Eventually, patient may require hospice care. Family is agreeable to this.   Dysphagia. Secondary to dementia. Seen by speech therapy. On a dysphagia diet.  History of essential hypertension and tachycardia. She is noted to be on verapamil at home. However, patient has  not been cooperative with oral intake in the hospital. Continue to monitor for now. Blood pressure has been reasonably well controlled.  Low-grade fever. UA was checked and did not show any evidence for infection. Remove Foley catheter. Fever could be due to recent surgery. Continue to monitor for now.  Normocytic anemia. Drop in hemoglobin noted. No evidence for overt bleeding. No bruising noted over the right thigh area. Repeat labs tomorrow.  Hypokalemia. Potassium has been repleted and normal today. Magnesium was noted to be low. Will be repleted.  DVT Prophylaxis: Lovenox  Code Status: DO NOT RESUSCITATE. Family Communication: Discussed with daughter  Disposition Plan: Plan is to repeat labs tomorrow. Placement being pursued to skilled nursing facility. Anticipate discharge tomorrow if labs are stable.    LOS: 4 days   [facility]  [facility] Pager [phone] 06/20/2017, 9:17 AM  If 7PM-7AM, please contact night-coverage at [email or web address], password [facility]

## 2017-06-20 NOTE — Progress Notes (Signed)
Physical Therapy Treatment Patient Details Name: Sydney Welch MRN: [id number] DOB: 27-Jun-1935 Today's Date: 06/20/2017    History of Present Illness 82 y.o. female with medical history significant of tachycardia and Alzheimer's with behavioral disturbance and admitted with R hip fracture, s/p R hip hemiarthroplasty 06/17/17    PT Comments    Pt assisted with sitting EOB and then attempted standing twice.  Pt not assisting yet not resistive to activity today.  Continue to recommend SNF upon d/c.  Follow Up Recommendations  SNF     Equipment Recommendations  None recommended by PT    Recommendations for Other Services       Precautions / Restrictions Precautions Precautions: Fall;Posterior Hip Restrictions Weight Bearing Restrictions: No Other Position/Activity Restrictions: WBAT    Mobility  Bed Mobility Overal bed mobility: Needs Assistance Bed Mobility: Supine to Sit;Sit to Supine     Supine to sit: Total assist;+2 for physical assistance Sit to supine: Total assist;+2 for physical assistance   General bed mobility comments: pt not actively assist with movement, no resistance  Transfers Overall transfer level: Needs assistance Equipment used: None Transfers: Sit to/from Stand Sit to Stand: Total assist;+2 physical assistance         General transfer comment: assist to stand utilizing bed pad and blocking L knee, maintained KI, performed twice  Ambulation/Gait                 Stairs            Wheelchair Mobility    Modified Rankin (Stroke Patients Only)       Balance Overall balance assessment: Needs assistance Sitting-balance support: Bilateral upper extremity supported Sitting balance-Leahy Scale: Poor Sitting balance - Comments: external support required                                    Cognition Arousal/Alertness: Lethargic Behavior During Therapy:  (eyes closed, minimal facial expressions) Overall  Cognitive Status: History of cognitive impairments - at baseline                                        Exercises      General Comments        Pertinent Vitals/Pain Pain Assessment: Faces Faces Pain Scale: Hurts little more Pain Location: R hip Pain Descriptors / Indicators: Grimacing Pain Intervention(s): Repositioned;Monitored during session;Premedicated before session    Home Living                      Prior Function            PT Goals (current goals can now be found in the care plan section) Progress towards PT goals: Progressing toward goals    Frequency    Min 2X/week      PT Plan Current plan remains appropriate    Co-evaluation PT/OT/SLP Co-Evaluation/Treatment: Yes Reason for Co-Treatment: For patient/therapist safety PT goals addressed during session: Mobility/safety with mobility OT goals addressed during session: ADL's and self-care      AM-PAC PT "6 Clicks" Daily Activity  Outcome Measure  Difficulty turning over in bed (including adjusting bedclothes, sheets and blankets)?: Unable Difficulty moving from lying on back to sitting on the side of the bed? : Unable Difficulty sitting down on and standing up from a chair with arms (e.g.,  wheelchair, bedside commode, etc,.)?: Unable Help needed moving to and from a bed to chair (including a wheelchair)?: Total Help needed walking in hospital room?: Total Help needed climbing 3-5 steps with a railing? : Total 6 Click Score: 6    End of Session Equipment Utilized During Treatment: Right knee immobilizer;Gait belt Activity Tolerance: Patient limited by lethargy Patient left: in bed;with bed alarm set;with call bell/phone within reach;with nursing/sitter in room   PT Visit Diagnosis: Other abnormalities of gait and mobility (R26.89)     Time: 1610-9604 PT Time Calculation (min) (ACUTE ONLY): 19 min  Charges:                       G CodesZenovia Jarred, PT,  DPT 06/20/2017 Pager: [phone]   Maida Sale E 06/20/2017, 12:16 PM

## 2017-06-21 ENCOUNTER — Inpatient Hospital Stay (HOSPITAL_COMMUNITY): Payer: Medicare Other

## 2017-06-21 DIAGNOSIS — R10817 Generalized abdominal tenderness: Secondary | ICD-10-CM

## 2017-06-21 LAB — CBC
HCT: 20.2 % — ABNORMAL LOW (ref 36.0–46.0)
Hemoglobin: 6.9 g/dL — CL (ref 12.0–15.0)
MCH: 31.9 pg (ref 26.0–34.0)
MCHC: 34.2 g/dL (ref 30.0–36.0)
MCV: 93.5 fL (ref 78.0–100.0)
PLATELETS: 288 10*3/uL (ref 150–400)
RBC: 2.16 MIL/uL — AB (ref 3.87–5.11)
RDW: 13.6 % (ref 11.5–15.5)
WBC: 6.6 10*3/uL (ref 4.0–10.5)

## 2017-06-21 LAB — BASIC METABOLIC PANEL
Anion gap: 10 (ref 5–15)
BUN: 7 mg/dL (ref 6–20)
CO2: 25 mmol/L (ref 22–32)
CREATININE: 0.42 mg/dL — AB (ref 0.44–1.00)
Calcium: 8 mg/dL — ABNORMAL LOW (ref 8.9–10.3)
Chloride: 112 mmol/L — ABNORMAL HIGH (ref 101–111)
Glucose, Bld: 95 mg/dL (ref 65–99)
Potassium: 4 mmol/L (ref 3.5–5.1)
SODIUM: 147 mmol/L — AB (ref 135–145)

## 2017-06-21 LAB — CULTURE, BLOOD (ROUTINE X 2)
CULTURE: NO GROWTH
Culture: NO GROWTH
SPECIAL REQUESTS: ADEQUATE
Special Requests: ADEQUATE

## 2017-06-21 LAB — HEMOGLOBIN AND HEMATOCRIT, BLOOD
HEMATOCRIT: 22.6 % — AB (ref 36.0–46.0)
HEMOGLOBIN: 7.8 g/dL — AB (ref 12.0–15.0)

## 2017-06-21 LAB — ABO/RH: ABO/RH(D): A POS

## 2017-06-21 LAB — PREPARE RBC (CROSSMATCH)

## 2017-06-21 MED ORDER — SODIUM CHLORIDE 0.9 % IV SOLN: Freq: Once | INTRAVENOUS | Status: DC

## 2017-06-21 NOTE — Progress Notes (Signed)
Spoke with Dr.Krishnan,  In and Out cath ordered, went in to in and out cath patient and she had an episode of urinary incontinence, bladder scanned again and left. Dr.Krishnan ordered to do another bladder scan at 3:30 and see what value is and let him know.

## 2017-06-21 NOTE — Progress Notes (Signed)
[facility] PROGRESS NOTE  LORETHA URE ZOX:[id number] DOB: 11/04/1934 DOA: 06/16/2017  PCP: Merlene Laughter, MD  Brief History/Interval Summary: 82 year old Caucasian female with a past medical history of advanced Alzheimer's dementia, presented after a mechanical fall and was found to have right hip fracture. She was hospitalized for further management. She underwent surgery. Hospitalization was complicated by encephalopathy. Also complicated by anemia requiring blood transfusion.  Reason for Visit: Right hip fracture  Consultants: Orthopedics. Palliative medicine.  Procedures: Status post right hip hemi-arthroplasty 10/1  Antibiotics: None  Subjective/Interval History: Patient is awake today with her eyes open. She still is very confused. Unable to communicate. 2 daughters are at the bedside.  ROS: Unable to do due to her dementia  Objective:  Vital Signs  Vitals:   06/20/17 0500 06/20/17 1402 06/20/17 2134 06/21/17 0547  BP: 128/87 101/64 (!) 100/56 110/83  Pulse: 89 90 80 95  Resp: Temp: 99.7 F (37.6 C) 98.2 F (36.8 C) 100.1 F (37.8 C) 98.2 F (36.8 C)  TempSrc: Oral Axillary Axillary Axillary  SpO2: 100% 100% 100% 100%  Weight:      Height:        Intake/Output Summary (Last 24 hours) at 06/21/17 0838 Last data filed at 06/21/17 0600  Gross per 24 hour  Intake          1250.83 ml  Output              500 ml  Net           750.83 ml   Filed Weights   06/18/17 0515  Weight: 42.2 kg (93 lb)    General appearance: Eyes open. In no distress. Resp: Clear to auscultation bilaterally without any wheezing, rales or rhonchi Cardio: S1, S2 is normal, regular. No S3, S4. No rubs, murmurs, bruits GI: Abdomen is obese, soft. Tenderness elicited diffusely without any rebound, rigidity or guarding. No masses or organomegaly. Neurologic: Patient remains distracted, disoriented. No obvious focal neurological deficits.   Lab Results:  Data  Reviewed: I have personally reviewed following labs and imaging studies  CBC:  Recent Labs Lab 06/16/17 1635 06/18/17 0553 06/19/17 0300 06/20/17 0456 06/21/17 0512  WBC 6.7 5.4 7.5 8.4 6.6  NEUTROABS 4.8  --   --   --   --   HGB 16.3* 10.9* 11.0* 8.0* 6.9*  HCT 48.1* 32.1* 32.4* 23.2* 20.2*  MCV 93.0 92.5 92.3 92.8 93.5  PLT 400 271 270 264 288    Basic Metabolic Panel:  Recent Labs Lab 06/16/17 1635 06/18/17 0553 06/19/17 0300 06/20/17 0456 06/21/17 0512  NA 140 140 140 145 147*  K 4.1 2.9* 3.4* 4.4 4.0  CL 101 107 107 113* 112*  CO2 GLUCOSE 91 90 81 130* 95  BUN 14 5* <5* 7 7  CREATININE 0.56 0.41*  0.43* 0.43* 0.47 0.42*  CALCIUM 9.7 8.2* 8.2* 8.3* 8.0*  MG  --   --  1.6*  --   --     GFR: Estimated Creatinine Clearance: 37.4 mL/min (A) (by C-G formula based on SCr of 0.42 mg/dL (L)).  Liver Function Tests:  Recent Labs Lab 06/16/17 1635  AST 27  ALT 16  ALKPHOS 63  BILITOT 1.1  PROT 7.6  ALBUMIN 4.4    Cardiac Enzymes:  Recent Labs Lab 06/16/17 1635  TROPONINI <0.03     Recent Results (from the past 240 hour(s))  Blood Culture (routine x  2)     Status: None (Preliminary result)   Collection Time: 06/16/17  1:40 PM  Result Value Ref Range Status   Specimen Description BLOOD LEFT ARM  Final   Special Requests   Final    Blood Culture adequate volume BOTTLES DRAWN AEROBIC AND ANAEROBIC   Culture   Final    NO GROWTH 4 DAYS Performed at [facility], [address], [place], [place] [address]    Report Status PENDING  Incomplete  Blood Culture (routine x 2)     Status: None (Preliminary result)   Collection Time: 06/16/17  5:00 PM  Result Value Ref Range Status   Specimen Description BLOOD LEFT FOREARM  Final   Special Requests   Final    Blood Culture adequate volume BOTTLES DRAWN AEROBIC AND ANAEROBIC   Culture   Final    NO GROWTH 4 DAYS Performed at [facility], [address], [place], [place]  [address]    Report Status PENDING  Incomplete  Surgical PCR screen     Status: Abnormal   Collection Time: 06/17/17  2:49 PM  Result Value Ref Range Status   MRSA, PCR POSITIVE (A) NEGATIVE Final    Comment: RESULT CALLED TO, READ BACK BY AND VERIFIED WITH: REBECCA GORDON,RN [id number] @ 2039 BY J SCOTTON    Staphylococcus aureus POSITIVE (A) NEGATIVE Final    Comment: RESULT CALLED TO, READ BACK BY AND VERIFIED WITH: REBECCA GORDON,RN [id number] @ 2039 BY J SCOTTON (NOTE) The Xpert SA Assay (FDA approved for NASAL specimens in patients 59 years of age and older), is one component of a comprehensive surveillance program. It is not intended to diagnose infection nor to guide or monitor treatment.       Radiology Studies: No results found.   Medications:  Scheduled: . Chlorhexidine Gluconate Cloth  6 each Topical Q0600  . docusate sodium  100 mg Oral BID  . enoxaparin (LOVENOX) injection  40 mg Subcutaneous Q24H  . mouth rinse  15 mL Mouth Rinse BID  . mupirocin ointment  1 application Nasal BID  . verapamil  240 mg Oral Daily   Continuous: . sodium chloride    . 0.9 % NaCl with KCl 40 mEq / L 50 mL/hr (06/20/17 1105)  . methocarbamol (ROBAXIN)  IV     QMV:HQIONGEXBMWUX **OR** acetaminophen, alum & mag hydroxide-simeth, LORazepam, menthol-cetylpyridinium **OR** phenol, methocarbamol **OR** methocarbamol (ROBAXIN)  IV, metoCLOPramide **OR** metoCLOPramide (REGLAN) injection, morphine injection, ondansetron **OR** ondansetron (ZOFRAN) IV, oxyCODONE, polyethylene glycol  Assessment/Plan:  Principal Problem:   Hip fracture (HCC) Active Problems:   Dementia with behavioral disturbance   Tachycardia   Closed fracture of neck of right femur (HCC)   Encounter for palliative care   Goals of care, counseling/discussion    Right hip fracture Patient is status post right hemiarthroplasty. She appears to be medically stable Despite the anemia. Pain appears to be better controlled on  current regimen. Appreciate palliative medicine assistance with this.   Abdominal tenderness Reason for this is not entirely clear. Bladder scan was done revealing 370 mL of urine. Abdominal films were also ordered which did not reveal any acute findings. We will proceed with in and out catheterization. We will see if this relieves her symptoms. Her Foley catheter was removed yesterday.  Normocytic anemia/possible acute blood loss Hemoglobin continues to drop. No evidence for overt bleeding. Hemodynamically she is stable. Reason for anemia is not entirely clear. We will transfuse her 1 unit  of blood. Repeat hemoglobin later today and then tomorrow. No significant swelling or bruising noted over the right thigh area..  Advanced dementia. Patient's daughter mentioned that patient has advanced which and has had difficulty swallowing for a long period of time. Over the last 5 weeks she has declined significantly with multiple falls at home. Her oral intake has been poor due to her advancing dementia. Based on this information it appears that the patient is declining and could be approaching end of life. Palliative medicine was consulted. They recommend placement to skilled nursing facility with palliative medicine to follow. Eventually, patient may require hospice care. Family is agreeable to this.   Dysphagia. Secondary to dementia. Seen by speech therapy. On a dysphagia diet.  History of essential hypertension and tachycardia. She is noted to be on verapamil at home. Continue to monitor for now. Blood pressure has been reasonably well controlled.  Low-grade fever. UA was checked and did not show any evidence for infection. Foley catheter was removed. Temperatures have improved. Continue to monitor  Hypokalemia/hypomagnesemia Potassium and magnesium were repleted.  DVT Prophylaxis: Lovenox  Code Status: DO NOT RESUSCITATE. Family Communication: Discussed with daughters  Disposition Plan: Blood  transfusion today. Possible discharge over the weekend if she remains stable.    LOS: 5 days   [facility]  [facility] Pager [phone] 06/21/2017, 8:38 AM  If 7PM-7AM, please contact night-coverage at [email or web address], password [facility]

## 2017-06-21 NOTE — Progress Notes (Signed)
CRITICAL VALUE ALERT  Critical Value: Hgb: 6.9  Date & Time Notied:  06/21/2017@0547   Provider Notified: X.Blount,MD  Orders Received/Actions taken: orders placed per provider for transfusion on 1 unit PRBCs.

## 2017-06-21 NOTE — Plan of Care (Signed)
Problem: Education: Goal: Knowledge of [facility] Education information/materials will improve Outcome: Not Progressing Dementia  Comments: Dementia. Family active in patient's care.

## 2017-06-21 NOTE — Progress Notes (Signed)
  Speech Language Pathology Treatment: Dysphagia  Patient Details Name: Sydney Welch MRN: [id number] DOB: 11/27/1934 Today's Date: 06/21/2017 Time: 6294-7654 SLP Time Calculation (min) (ACUTE ONLY): 10 min  Assessment / Plan / Recommendation Clinical Impression  F/u education with daughter.  Pt sleeping soundly.  Daughter reports much better appetite at breakfast, drinking thin liquids with no coughing or "strangling"- reviewed basic precautions given current status.  Did not awaken pt. Recommend continued assistance with POs per pt's desires - no further SLP f/u is warranted.  Our services will respectfully sign off.   HPI HPI: 82 year old female admitted 9/30 after sustaining right hip fracture due to fall. PMH significant for tachycardia, advanced dementia, vertigo, multiple falls. Pt underwent ORIF 06/17/17.  BSE requested to determine least restrictive diet.       SLP Plan  All goals met;Discharge SLP treatment due to (comment)       Recommendations  Diet recommendations: Dysphagia 1 (puree);Thin liquid;Nectar-thick liquid Medication Administration: Crushed with puree Supervision: Full supervision/cueing for compensatory strategies Compensations: Minimize environmental distractions;Slow rate;Small sips/bites                Oral Care Recommendations: Oral care QID Follow up Recommendations: 24 hour supervision/assistance;Skilled Nursing facility SLP Visit Diagnosis: Dysphagia, unspecified (R13.10) Plan: All goals met;Discharge SLP treatment due to (comment)       GO                Juan Quam Laurice 06/21/2017, 2:15 PM

## 2017-06-22 LAB — TYPE AND SCREEN
ABO/RH(D): A POS
Antibody Screen: NEGATIVE
UNIT DIVISION: 0

## 2017-06-22 LAB — BASIC METABOLIC PANEL
ANION GAP: 10 (ref 5–15)
BUN: 9 mg/dL (ref 6–20)
CALCIUM: 8.2 mg/dL — AB (ref 8.9–10.3)
CO2: 28 mmol/L (ref 22–32)
Chloride: 108 mmol/L (ref 101–111)
Creatinine, Ser: 0.32 mg/dL — ABNORMAL LOW (ref 0.44–1.00)
Glucose, Bld: 90 mg/dL (ref 65–99)
Potassium: 3.6 mmol/L (ref 3.5–5.1)
Sodium: 146 mmol/L — ABNORMAL HIGH (ref 135–145)

## 2017-06-22 LAB — CBC
HEMATOCRIT: 23.7 % — AB (ref 36.0–46.0)
HEMOGLOBIN: 8.1 g/dL — AB (ref 12.0–15.0)
MCH: 31.9 pg (ref 26.0–34.0)
MCHC: 34.2 g/dL (ref 30.0–36.0)
MCV: 93.3 fL (ref 78.0–100.0)
Platelets: 292 10*3/uL (ref 150–400)
RBC: 2.54 MIL/uL — ABNORMAL LOW (ref 3.87–5.11)
RDW: 14.3 % (ref 11.5–15.5)
WBC: 4.7 10*3/uL (ref 4.0–10.5)

## 2017-06-22 LAB — BPAM RBC
BLOOD PRODUCT EXPIRATION DATE: 201810162359
ISSUE DATE / TIME: 201810050854
Unit Type and Rh: 6200

## 2017-06-22 MED ORDER — DOCUSATE SODIUM 100 MG PO CAPS: 100.0000 mg | ORAL_CAPSULE | Freq: Two times a day (BID) | ORAL | 0 refills | Status: AC

## 2017-06-22 MED ORDER — ENOXAPARIN SODIUM 30 MG/0.3ML ~~LOC~~ SOLN: 30.0000 mg | SUBCUTANEOUS | Status: DC

## 2017-06-22 MED ORDER — LORAZEPAM 1 MG PO TABS: 1.0000 mg | ORAL_TABLET | Freq: Four times a day (QID) | ORAL | 0 refills | Status: AC | PRN

## 2017-06-22 MED ORDER — ACETAMINOPHEN 325 MG PO TABS: 650.0000 mg | ORAL_TABLET | Freq: Four times a day (QID) | ORAL | Status: AC | PRN

## 2017-06-22 MED ORDER — ENOXAPARIN SODIUM 30 MG/0.3ML ~~LOC~~ SOLN: 30.0000 mg | SUBCUTANEOUS | Status: AC

## 2017-06-22 MED ORDER — POLYETHYLENE GLYCOL 3350 17 G PO PACK: 17.0000 g | PACK | Freq: Every day | ORAL | 0 refills | Status: AC | PRN

## 2017-06-22 MED ORDER — ENSURE PO LIQD: 237.0000 mL | Freq: Three times a day (TID) | ORAL | 12 refills | Status: AC

## 2017-06-22 MED ORDER — METHOCARBAMOL 500 MG PO TABS: 500.0000 mg | ORAL_TABLET | Freq: Four times a day (QID) | ORAL | 0 refills | Status: AC | PRN

## 2017-06-22 MED ORDER — OXYCODONE HCL 20 MG/ML PO CONC: 5.0000 mg | ORAL | 0 refills | Status: AC | PRN

## 2017-06-22 NOTE — Progress Notes (Addendum)
LCSW following for disposition.  Patient medically stable per MD. Call placed to [facility] to notify of discharge and ensure bed placement. Message has been left.  Will follow up once [place] has returned call. LCSW spoke with RN and updated that LCSW is currently working on discharge plans.  1:06 PM Call returned from [place].  Currently there are no beds for patient to admit too, thus LCSW is exploring other options. Bed offer from [facility]: call placed and message left, no return call back. [facility]:  Did make contact with admissions, however she was with a family and will call LCSW back.    LCSW will discuss with family regarding next steps for placement/considerations as first choice is not available.  1:27 PM LCSW spoke with family at the bedside as well as called [name] the POA about barriers with admitting to [facility]. She is open to [facility] as well as [facility] with [facility] being her first choice.   LCSW has called [facility], awaiting call back regarding if patient can admit today.  RN updated regarding plan.  2:11 PM Spoke with [facility], they are working to see if they can admit patient today. She is checking discharges and will follow up.  2:39 PM Patient has been accepted to [facility].per admissions: [place]. Authorization obtained by [facility] Medicare:  [id number] Affective tomorrow 06/21/17 This authorization is good through 10/7 If member does not discharge over the weekend, LCSW is to call [facility] and make them aware to extend the auth or cancel on Monday. Call:  [phone]  [phone] Patient has been approved at a RUG LEVEL:  2HB 493 therapy minutes.   Patient's POA called: [name] who will sign her in after 3pm. Patient will admit after paperwork completed most likely after 4pm. Patient will go by EMS. LCSW will call insurance company on Monday to change bed choice to [facility].    Deretha Emory, MSW Clinical Social Work: Lawyer Coverage for :  [phone]

## 2017-06-22 NOTE — Progress Notes (Signed)
Report given to [facility] nursing home; Receiving nurse verbalizes understanding. Pt left with [facility] transport unit and family is following over to nursing home

## 2017-06-22 NOTE — Discharge Instructions (Signed)
Palliative Care Palliative care is the care of your body, mind, and spirit. Palliative care services are offered to people dealing with serious and life-threatening illnesses, often in the hospital or a long-term care setting. Palliative care requires a team of people who will help to ensure:  Pain and symptoms are controlled.  Family support.  Spiritual support.  Emotional and social support.  Comfort.  Palliative care is a way to bring comfort and peace of mind to a person and his or her family. It can also have a positive impact on the course of illness. Who can receive palliative care services? Palliative care is offered to children and adults when they are seriously ill and may not be responding well to treatment options. The person may be undergoing active treatment, such as chemotherapy, or may have stopped active treatment. Some people may have just been diagnosed with advanced disease or life-limiting illness. A health care provider will usually recommend palliative care services when more support would be helpful. What types of services are included in palliative care? Palliative care includes a team of health care providers and supporters who come together to help a person facing serious and life-threatening illness. The person's existing doctors are included in the care team, and supporters are added. Family members and friends may also receive palliative care services to cope with stress and other concerns. Services are different for each person and are based on the person's needs and preferences. The following people make up a palliative care team:  The person receiving care and his or her family.  Physicians, including primary health care providers and specialists.  Nurses.  A psychosocial worker.  Other people on the palliative care team may include:  A pain specialist and sometimes a hospice specialist.  A financial or Passenger transport manager.  [employer],  such as school and spiritual organizations.  Religious leaders.  A care coordinator or case manager.  A bereavement coordinator.  The team will talk with the person and his or her family about:  The role of the pain specialist and the hospice specialist if this applies.  The person's active physical symptoms, such as pain, nausea, vomiting, and shortness of breath.  Stress, depression, and anxiety symptoms.  Function and mobility issues and how to stay as active as possible.  Treatment options and how they may affect life.  Spiritual wishes, such as rituals and prayer.  Legacy and memory making activities.  Life and death as a normal process.  Advance directives or living wills, health care proxies, and end-of-life care.  Any other concerns or issues.  The palliative care team will make it okay to talk about difficult issues and topics. Preferences and sensitive spiritual and emotional concerns are of high importance. Palliative care and hospice are somewhat different Palliative care and hospice care have similar goals of managing symptoms, promoting comfort, and maintaining dignity. However, hospice care is an option for people during the last 6 months of life expectancy. The palliative care team will coordinate the many support services provided during any phase of serious illness. This information is not intended to replace advice given to you by your health care provider. Make sure you discuss any questions you have with your health care provider. Document Released: 09/08/2013 Document Revised: 02/09/2016 Document Reviewed: 07/21/2013 Elsevier Interactive Patient Education  2018 [employer].  Dementia Dementia is the loss of two or more brain functions, such as:  Memory.  Decision making.  Behavior.  Speaking.  Thinking.  Problem  solving.  There are many types of dementia. The most common type is called progressive dementia. Progressive dementia gets worse  with time and it is irreversible. An example of this type of dementia is Alzheimer disease. What are the causes? This condition may be caused by:  Nerve cell damage in the brain.  Genetic mutations.  Certain medicines.  Multiple small strokes.  An infection, such as chronic meningitis.  A metabolic problem, such as vitamin B12 deficiency or thyroid disease.  Pressure on the brain, such as from a tumor or blood clot.  What are the signs or symptoms? Symptoms of this condition include:  Sudden changes in mood.  Depression.  Problems with balance.  Changes in personality.  Poor short-term memory.  Agitation.  Delusions.  Hallucinations.  Having a hard time: ? Speaking thoughts. ? Finding words. ? Solving problems. ? Doing familiar tasks. ? Understanding familiar ideas.  How is this diagnosed? This condition is diagnosed with an assessment by your health care provider. During this assessment, your health care provider will talk with you and your family, friends, or caregivers about your symptoms. A thorough medical history will be taken, and you will have a physical exam and tests. Tests may include:  Lab tests, such as blood or urine tests.  Imaging tests, such as a CT scan, PET scan, or MRI.  A lumbar puncture. This test involves removing and testing a small amount of the fluid that surrounds the brain and spinal cord.  An electroencephalogram (EEG). In this test, small metal discs are used to measure electrical activity in the brain.  Memory tests, cognitive tests, and neuropsychological tests. These tests evaluate brain function.  How is this treated? Treatment depends on the cause of the dementia. It may involve taking medicines that may help:  To control the dementia.  To slow down the disease.  To manage symptoms.  In some cases, treating the cause of the dementia can improve symptoms, reverse symptoms, or slow down how quickly the dementia gets  worse. Your health care provider can help direct you to support groups, organizations, and other health care providers who can help with decisions about your care. Follow these instructions at home: Medicine  Take over-the-counter and prescription medicines only as told by your health care provider.  Avoid taking medicines that can affect thinking, such as pain or sleeping medicines. Lifestyle   Make healthy lifestyle choices: ? Be physically active as told by your health care provider. ? Do not use any tobacco products, such as cigarettes, chewing tobacco, and e-cigarettes. If you need help quitting, ask your health care provider. ? Eat a healthy diet. ? Practice stress-management techniques when you get stressed. ? Stay social.  Drink enough fluid to keep your urine clear or pale yellow.  Make sure to get quality sleep. These tips can help you to get a good night's rest: ? Avoid napping during the day. ? Keep your sleeping area dark and cool. ? Avoid exercising during the few hours before you go to bed. ? Avoid caffeine products in the evening. General instructions  Work with your health care provider to determine what you need help with and what your safety needs are.  If you were given a bracelet that tracks your location, make sure to wear it.  Keep all follow-up visits as told by your health care provider. This is important. Contact a health care provider if:  You have any new symptoms.  You have problems with choking or  swallowing.  You have any symptoms of a different illness. Get help right away if:  You develop a fever.  You have new or worsening confusion.  You have new or worsening sleepiness.  You have a hard time staying awake.  You or your family members become concerned for your safety. This information is not intended to replace advice given to you by your health care provider. Make sure you discuss any questions you have with your health care  provider. Document Released: 02/27/2001 Document Revised: 01/12/2016 Document Reviewed: 06/01/2015 Elsevier Interactive Patient Education  2017 [employer].

## 2017-06-22 NOTE — Clinical Social Work Placement (Signed)
   CLINICAL SOCIAL WORK PLACEMENT  NOTE  Date:  06/22/2017  Patient Details  Name: Sydney Welch MRN: [id number] Date of Birth: 10-09-1934  Clinical Social Work is seeking post-discharge placement for this patient at the Skilled  Nursing Facility level of care (*CSW will initial, date and re-position this form in  chart as items are completed):  Yes   Patient/family provided with [facility] Clinical Social Work Department's list of facilities offering this level of care within the geographic area requested by the patient (or if unable, by the patient's family).  Yes   Patient/family informed of their freedom to choose among providers that offer the needed level of care, that participate in Medicare, Medicaid or managed care program needed by the patient, have an available bed and are willing to accept the patient.  Yes   Patient/family informed of [facility]'s ownership interest in [facility] and [facility], as well as of the fact that they are under no obligation to receive care at these facilities.  PASRR submitted to EDS on 06/22/17     PASRR number received on 06/22/17     Existing PASRR number confirmed on 06/22/17     FL2 transmitted to all facilities in geographic area requested by pt/family on       FL2 transmitted to all facilities within larger geographic area on       Patient informed that his/her managed care company has contracts with or will negotiate with certain facilities, including the following:        Yes   Patient/family informed of bed offers received.  Patient chooses bed at [facility]     Physician recommends and patient chooses bed at [facility]    Patient to be transferred to [facility] on 06/22/17.  Patient to be transferred to facility by EMS     Patient family notified on 06/22/17 of transfer.  Name of family member notified:  [name] POA sister     PHYSICIAN Please sign FL2,  Please sign DNR     Additional Comment:    _______________________________________________ Raye Sorrow, LCSW 06/22/2017, 2:46 PM

## 2017-06-22 NOTE — Progress Notes (Signed)
SW called and left message requesting call back to discuss Skilled Nursing transfer; Per Dr Rito Ehrlich okay for patient to discharge back to [facility] today.

## 2017-06-22 NOTE — Care Management Note (Signed)
Case Management Note  Patient Details  Name: RAYLYNNE CUBBAGE MRN: [id number] Date of Birth: 07/19/1935  Subjective/Objective:  Hip fracture, closed fracture of right femur                  Action/Plan: Discharge Planning: Chart reviewed. CSW following for SNF, Scheduled dc to SNF today.   PCP Merlene Laughter MD  Expected Discharge Date:  06/22/17               Expected Discharge Plan:  Skilled Nursing Facility  In-House Referral:  Clinical Social Work  Discharge planning Services  CM Consult  Post Acute Care Choice:  NA Choice offered to:  NA  DME Arranged:  N/A DME Agency:  NA  HH Arranged:  NA HH Agency:  NA  Status of Service:  Completed, signed off  If discussed at Long Length of Stay Meetings, dates discussed:    Additional Comments:  Elliot Cousin, RN 06/22/2017, 11:40 AM

## 2017-06-22 NOTE — Discharge Summary (Signed)
[facility]  Physician Discharge Summary   Patient ID: Sydney Welch MRN: [id number] DOB/AGE: August 22, 1935 82 y.o.  Admit date: 06/16/2017 Discharge date: 06/22/2017  PCP: Merlene Laughter, MD  DISCHARGE DIAGNOSES:  Principal Problem:   Hip fracture (HCC) Active Problems:   Dementia with behavioral disturbance   Tachycardia   Closed fracture of neck of right femur (HCC)   Encounter for palliative care   Goals of care, counseling/discussion   RECOMMENDATIONS FOR OUTPATIENT FOLLOW UP: 1. CBC and basic metabolic panel in 4-5 days 2. Palliative medicine to follow the patient. Transition to hospice depending on patient's clinical status. See below  DISCHARGE CONDITION: fair  Diet recommendation: Dysphagia 1 diet with thin liquids  Filed Weights   06/18/17 0515  Weight: 42.2 kg (93 lb)    INITIAL HISTORY: 82 year old Caucasian female with a past medical history of advanced Alzheimer's dementia, presented after a mechanical fall and was found to have right hip fracture. She was hospitalized for further management. She underwent surgery. Hospitalization was complicated by encephalopathy. Also complicated by anemia requiring blood transfusion.  Consultants: Orthopedics. Palliative medicine.  Procedures: Status post right hip hemi-arthroplasty 10/1   HOSPITAL COURSE:   Right hip fracture Patient is status post right hemiarthroplasty. She appears to be medically stable despite the anemia. Pain appears to be better controlled on current regimen. Appreciate palliative medicine assistance with this. DVT prophylaxis with Lovenox for total of 10 days was recommended by orthopedics.  Abdominal tenderness Reason for this is not entirely clear. Bladder scan was done revealing 370 mL of urine. Abdominal films were also ordered which did not reveal any acute findings. In and out catheterization was considered, however, patient proceeded to urinate on her own. She could also be  experiencing referred pain from her hip. Her vital signs are all stable. She is tolerating her diet without any nausea, vomiting. This will need continued monitoring at the skilled nursing facility.  Normocytic anemia/possible acute blood loss Hemoglobin did drop further, requiring blood transfusion. Posttransfusion hemoglobin has been stable. She does not have any overt bleeding.   Advanced dementia. Patient's daughter mentioned that patient has advanced dementia and has had difficulty swallowing for a long period of time. Over the last 5 weeks she has declined significantly with multiple falls at home. Her oral intake has been poor due to her advancing dementia. Based on this information it appears that the patient is declining and could be approaching end of life. Palliative medicine was consulted. They recommend placement to skilled nursing facility with palliative medicine to follow. Eventually, patient may require hospice care. Family is agreeable to this.   Dysphagia. Secondary to dementia. Seen by speech therapy. On a dysphagia diet. Needs follow aspiration precautions.  History of essential hypertension and tachycardia. She is noted to be on verapamil at home. Continue to monitor for now. Blood pressure has been reasonably well controlled.  Low-grade fever. UA was checked and did not show any evidence for infection. Foley catheter was removed. Temperatures have improved.   Hypokalemia/hypomagnesemia Potassium and magnesium were repleted.  Overall, stable. Okay for discharge to skilled nursing facility.    PERTINENT LABS:  The results of significant diagnostics from this hospitalization (including imaging, microbiology, ancillary and laboratory) are listed below for reference.    Microbiology: Recent Results (from the past 240 hour(s))  Blood Culture (routine x 2)     Status: None   Collection Time: 06/16/17  1:40 PM  Result Value Ref Range Status   Specimen Description  BLOOD LEFT ARM  Final   Special Requests   Final    Blood Culture adequate volume BOTTLES DRAWN AEROBIC AND ANAEROBIC   Culture   Final    NO GROWTH 5 DAYS Performed at [facility], [address] [place], [place] [address]    Report Status 06/21/2017 FINAL  Final  Blood Culture (routine x 2)     Status: None   Collection Time: 06/16/17  5:00 PM  Result Value Ref Range Status   Specimen Description BLOOD LEFT FOREARM  Final   Special Requests   Final    Blood Culture adequate volume BOTTLES DRAWN AEROBIC AND ANAEROBIC   Culture   Final    NO GROWTH 5 DAYS Performed at [facility], [address], [place], [place] [address]    Report Status 06/21/2017 FINAL  Final  Surgical PCR screen     Status: Abnormal   Collection Time: 06/17/17  2:49 PM  Result Value Ref Range Status   MRSA, PCR POSITIVE (A) NEGATIVE Final    Comment: RESULT CALLED TO, READ BACK BY AND VERIFIED WITH: REBECCA GORDON,RN [id number] @ 2039 BY J SCOTTON    Staphylococcus aureus POSITIVE (A) NEGATIVE Final    Comment: RESULT CALLED TO, READ BACK BY AND VERIFIED WITH: REBECCA GORDON,RN [id number] @ 2039 BY J SCOTTON (NOTE) The Xpert SA Assay (FDA approved for NASAL specimens in patients 51 years of age and older), is one component of a comprehensive surveillance program. It is not intended to diagnose infection nor to guide or monitor treatment.      Labs: Basic Metabolic Panel:  Recent Labs Lab 06/18/17 0553 06/19/17 0300 06/20/17 0456 06/21/17 0512 06/22/17 0536  NA 140 140 145 147* 146*  K 2.9* 3.4* 4.4 4.0 3.6  CL 107 107 113* 112* 108  CO2 GLUCOSE 90 81 130* 95 90  BUN 5* <5* CREATININE 0.41*  0.43* 0.43* 0.47 0.42* 0.32*  CALCIUM 8.2* 8.2* 8.3* 8.0* 8.2*  MG  --  1.6*  --   --   --    Liver Function Tests:  Recent Labs Lab 06/16/17 1635  AST 27  ALT 16  ALKPHOS 63  BILITOT 1.1  PROT 7.6  ALBUMIN 4.4   CBC:  Recent Labs Lab 06/16/17 1635  06/18/17 0553 06/19/17 0300 06/20/17 0456 06/21/17 0512 06/21/17 1322 06/22/17 0536  WBC 6.7 5.4 7.5 8.4 6.6  --  4.7  NEUTROABS 4.8  --   --   --   --   --   --   HGB 16.3* 10.9* 11.0* 8.0* 6.9* 7.8* 8.1*  HCT 48.1* 32.1* 32.4* 23.2* 20.2* 22.6* 23.7*  MCV 93.0 92.5 92.3 92.8 93.5  --  93.3  PLT 400 271 270 264 288  --  292   Cardiac Enzymes:  Recent Labs Lab 06/16/17 1635  TROPONINI <0.03    IMAGING STUDIES Dg Chest 2 View  Result Date: 06/16/2017 CLINICAL DATA:  Mental status change. EXAM: CHEST  2 VIEW COMPARISON:  None. FINDINGS: The heart size is normal. The lungs are hyperinflated but clear. No pleural effusion or edema. Thoracic scoliosis identified. IMPRESSION: 1. No acute cardiopulmonary abnormalities. 2. Scoliosis. Electronically Signed   By: Signa Kell M.D.   On: 06/16/2017 17:57   Pelvis Portable  Result Date: 06/17/2017 CLINICAL DATA:  Right hip replacement. EXAM: PORTABLE PELVIS 1-2 VIEWS COMPARISON:  06/16/2017 FINDINGS: Interval placement of a unipolar right  hip arthroplasty intact and normally located. Diffuse osteopenia. Mild degenerate change of the left hip. IMPRESSION: Right hip arthroplasty intact and normally located. Electronically Signed   By: Elberta Fortis M.D.   On: 06/17/2017 20:23   Dg Pelvis Portable  Result Date: 06/16/2017 CLINICAL DATA:  Right hip fracture noted on recent CT scan today. EXAM: PORTABLE PELVIS 1-2 VIEWS COMPARISON:  Plain films earlier today as well as CT earlier today. FINDINGS: Diffuse osteopenia. Mild degenerative changes of the hips right worse than left. There is foreshortening of the right femoral neck as a discrete fracture line is not visualized. Degenerative change of the spine. Pelvic phleboliths. IMPRESSION: Foreshortening of the right femoral neck without definite fracture line visualized. Mild degenerative changes of the hips right worse than left. Electronically Signed   By: Elberta Fortis M.D.   On: 06/16/2017 23:16     Dg Abd 2 Views  Result Date: 06/16/2017 CLINICAL DATA:  Abdominal pain. EXAM: ABDOMEN - 2 VIEW COMPARISON:  None. FINDINGS: The bowel gas pattern is normal. Moderate stool burden identified within the colon. There is no evidence of free air. No radio-opaque calculi or other significant radiographic abnormality is seen. IMPRESSION: Nonobstructive bowel gas pattern. Moderate stool burden within the colon. Electronically Signed   By: Signa Kell M.D.   On: 06/16/2017 18:00   Dg Abd Portable 2v  Result Date: 06/21/2017 CLINICAL DATA:  Abdominal pain EXAM: PORTABLE ABDOMEN - 2 VIEW COMPARISON:  06/16/2017 FINDINGS: Scattered large and small bowel gas is noted. No obstructive changes are noted. No free air is seen. Postsurgical changes are noted in the right hip. Scoliosis of the thoracolumbar spine is noted concave to right. IMPRESSION: No evidence of obstruction or free air. Electronically Signed   By: Alcide Clever M.D.   On: 06/21/2017 10:05   Ct Renal Stone Study  Result Date: 06/16/2017 CLINICAL DATA:  Altered mental status with possible UTI. EXAM: CT ABDOMEN AND PELVIS WITHOUT CONTRAST TECHNIQUE: Multidetector CT imaging of the abdomen and pelvis was performed following the standard protocol without IV contrast. COMPARISON:  None. FINDINGS: Lower chest: Minimal dependent atelectasis over the right base. Hepatobiliary: The liver and biliary tree are within normal. Possible sludge versus small stones within the dependent portion of the gallbladder. Pancreas: Pancreas somewhat difficult to visualize on this noncontrast study although no definite abnormality. Spleen: Within normal. Adrenals/Urinary Tract: Adrenal glands are normal. Kidneys are normal in size without hydronephrosis or nephrolithiasis. 1.1 cm cyst over the mid to upper pole left kidney with subcentimeter hyperdensity over the corticomedullary junction of the mid to upper pole left kidney with Hounsfield unit measurements of 99 likely  early calcification. Ureters are unremarkable. Tiny focus of air within the nondependent portion of the bladder compatible with known recent catheterization. Stomach/Bowel: The stomach and small bowel are within normal. Appendix is normal. There is diverticulosis of the colon with mild fecal retention throughout the colon. Vascular/Lymphatic: Minimal calcified plaque over the abdominal aorta. No significant adenopathy. Reproductive: Within normal. Other: No significant free fluid or focal inflammatory change. No free peritoneal air. Musculoskeletal: Moderate degenerate changes spine with curvature of the thoracolumbar spine convex left. Mild compression fracture of L4 age indeterminate. Mild degenerative change of the hips. Subcapital fracture of the right femoral neck likely subacute nature with minimal displacement. IMPRESSION: No acute findings in the abdomen/pelvis. Minimally displaced subcapital fracture of the right femoral neck likely subacute nature. Suggestion of mild gallbladder sludge versus stones. 1.1 cm left renal cyst with suggestion  mild left focal nephrocalcinosis. Diverticulosis of the colon. Mild L4 compression fracture likely chronic. These results were called by telephone at the time of interpretation on 06/16/2017 at 8:33 pm to Dr. Jacalyn Lefevre , who verbally acknowledged these results. Electronically Signed   By: Elberta Fortis M.D.   On: 06/16/2017 20:33    DISCHARGE EXAMINATION: Vitals:   06/21/17 1150 06/21/17 1443 06/21/17 2355 06/22/17 0628  BP: (!) 155/110 (!) 108/56 120/69 106/66  Pulse: 95 72 75 71  Resp: 18 16 16    Temp: 98.2 F (36.8 C) 97.6 F (36.4 C) (!) 97.2 F (36.2 C) 98.4 F (36.9 C)  TempSrc: Axillary Axillary Axillary Axillary  SpO2: 98% 96% 95% 98%  Weight:      Height:       General appearance: On the bed with eyes closed., does acknowledge my presence. However, remains distracted.  Resp: clear to auscultation bilaterally Cardio: regular rate and  rhythm, S1, S2 normal, no murmur, click, rub or gallop GI: Mildly tender in the lower abdomen without any rebound, rigidity or guarding. Bowel sounds are present and normal. No masses or organomegaly. Extremities: No bruising noted over the right hip area.  DISPOSITION: SNF  Discharge Instructions    Call MD for:  difficulty breathing, headache or visual disturbances    Complete by:  As directed    Call MD for:  extreme fatigue    Complete by:  As directed    Call MD for:  persistant dizziness or light-headedness    Complete by:  As directed    Call MD for:  persistant nausea and vomiting    Complete by:  As directed    Call MD for:  severe uncontrolled pain    Complete by:  As directed    Call MD for:  temperature >100.4    Complete by:  As directed    Discharge instructions    Complete by:  As directed    Please review discharge summary for instructions.  You were cared for by a hospitalist during your hospital stay. If you have any questions about your discharge medications or the care you received while you were in the hospital after you are discharged, you can call the unit and asked to speak with the hospitalist on call if the hospitalist that took care of you is not available. Once you are discharged, your primary care physician will handle any further medical issues. Please note that NO REFILLS for any discharge medications will be authorized once you are discharged, as it is imperative that you return to your primary care physician (or establish a relationship with a primary care physician if you do not have one) for your aftercare needs so that they can reassess your need for medications and monitor your lab values. If you do not have a primary care physician, you can call [phone] for a physician referral.   Increase activity slowly    Complete by:  As directed    Weight bearing as tolerated    Complete by:  As directed       ALLERGIES:  Allergies  Allergen Reactions  .  Caffeine      Current Discharge Medication List    START taking these medications   Details  acetaminophen (TYLENOL) 325 MG tablet Take 2 tablets (650 mg total) by mouth every 6 (six) hours as needed for mild pain (or Fever >/= 101).    docusate sodium (COLACE) 100 MG capsule Take 1 capsule (100 mg total)  by mouth 2 (two) times daily. Qty: 10 capsule, Refills: 0    enoxaparin (LOVENOX) 30 MG/0.3ML injection Inject 0.3 mLs (30 mg total) into the skin daily. For 6 more days Qty: 0 Syringe    methocarbamol (ROBAXIN) 500 MG tablet Take 1 tablet (500 mg total) by mouth every 6 (six) hours as needed for muscle spasms. Qty: 30 tablet, Refills: 0    oxyCODONE (ROXICODONE INTENSOL) 20 MG/ML concentrated solution Take 0.3 mLs (6 mg total) by mouth every 4 (four) hours as needed for severe pain. Qty: 15 mL, Refills: 0    polyethylene glycol (MIRALAX / GLYCOLAX) packet Take 17 g by mouth daily as needed for mild constipation. Qty: 14 each, Refills: 0      CONTINUE these medications which have CHANGED   Details  ENSURE (ENSURE) Take 237 mLs by mouth 3 (three) times daily between meals. Qty: 237 mL, Refills: 12    LORazepam (ATIVAN) 1 MG tablet Take 1 tablet (1 mg total) by mouth every 6 (six) hours as needed for anxiety. Qty: 30 tablet, Refills: 0      CONTINUE these medications which have NOT CHANGED   Details  verapamil (CALAN-SR) 240 MG CR tablet Take 240 mg by mouth daily. Refills: 0          Contact information for follow-up providers    Durene Romans, MD. Schedule an appointment as soon as possible for a visit in 2 week(s).   Specialty:  Orthopedic Surgery Why:  to follow up with wound and motion Contact information: [address] [place] [address] [phone]            Contact information for after-discharge care    Destination    [facility] Follow up.   Specialty:  Skilled Nursing Facility Contact information: [address] [place] [address] [phone]                  TOTAL DISCHARGE TIME: 35 minutes  [facility]  [facility] Pager [phone]  06/22/2017, 11:07 AM

## 2017-07-18 DEATH — deceased

## 2018-04-03 IMAGING — DX DG PORTABLE PELVIS
2 series · 2 of 2 positions shown · non-contrast
Comparison: 06/16/2017

CLINICAL DATA: Right hip replacement.

EXAM:
PORTABLE PELVIS 1-2 VIEWS

[pelvis ap (1 of 2)]
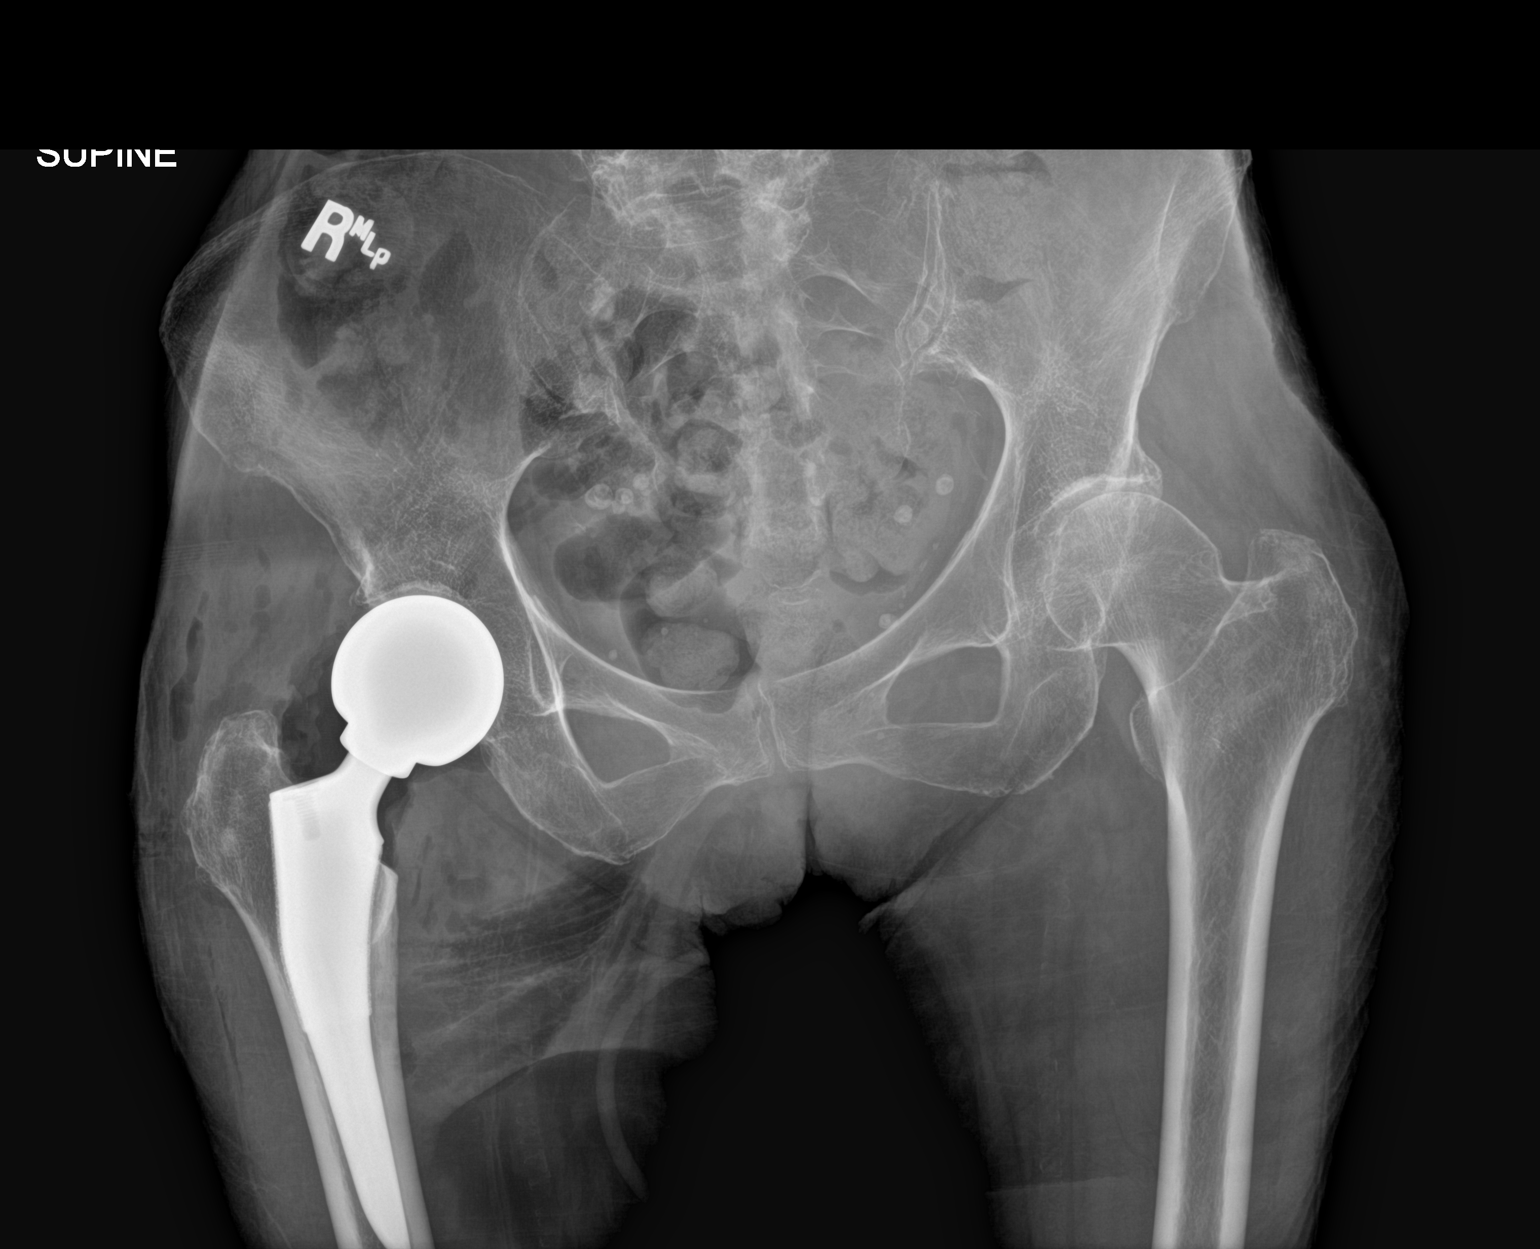

[pelvis ap (2 of 2)]
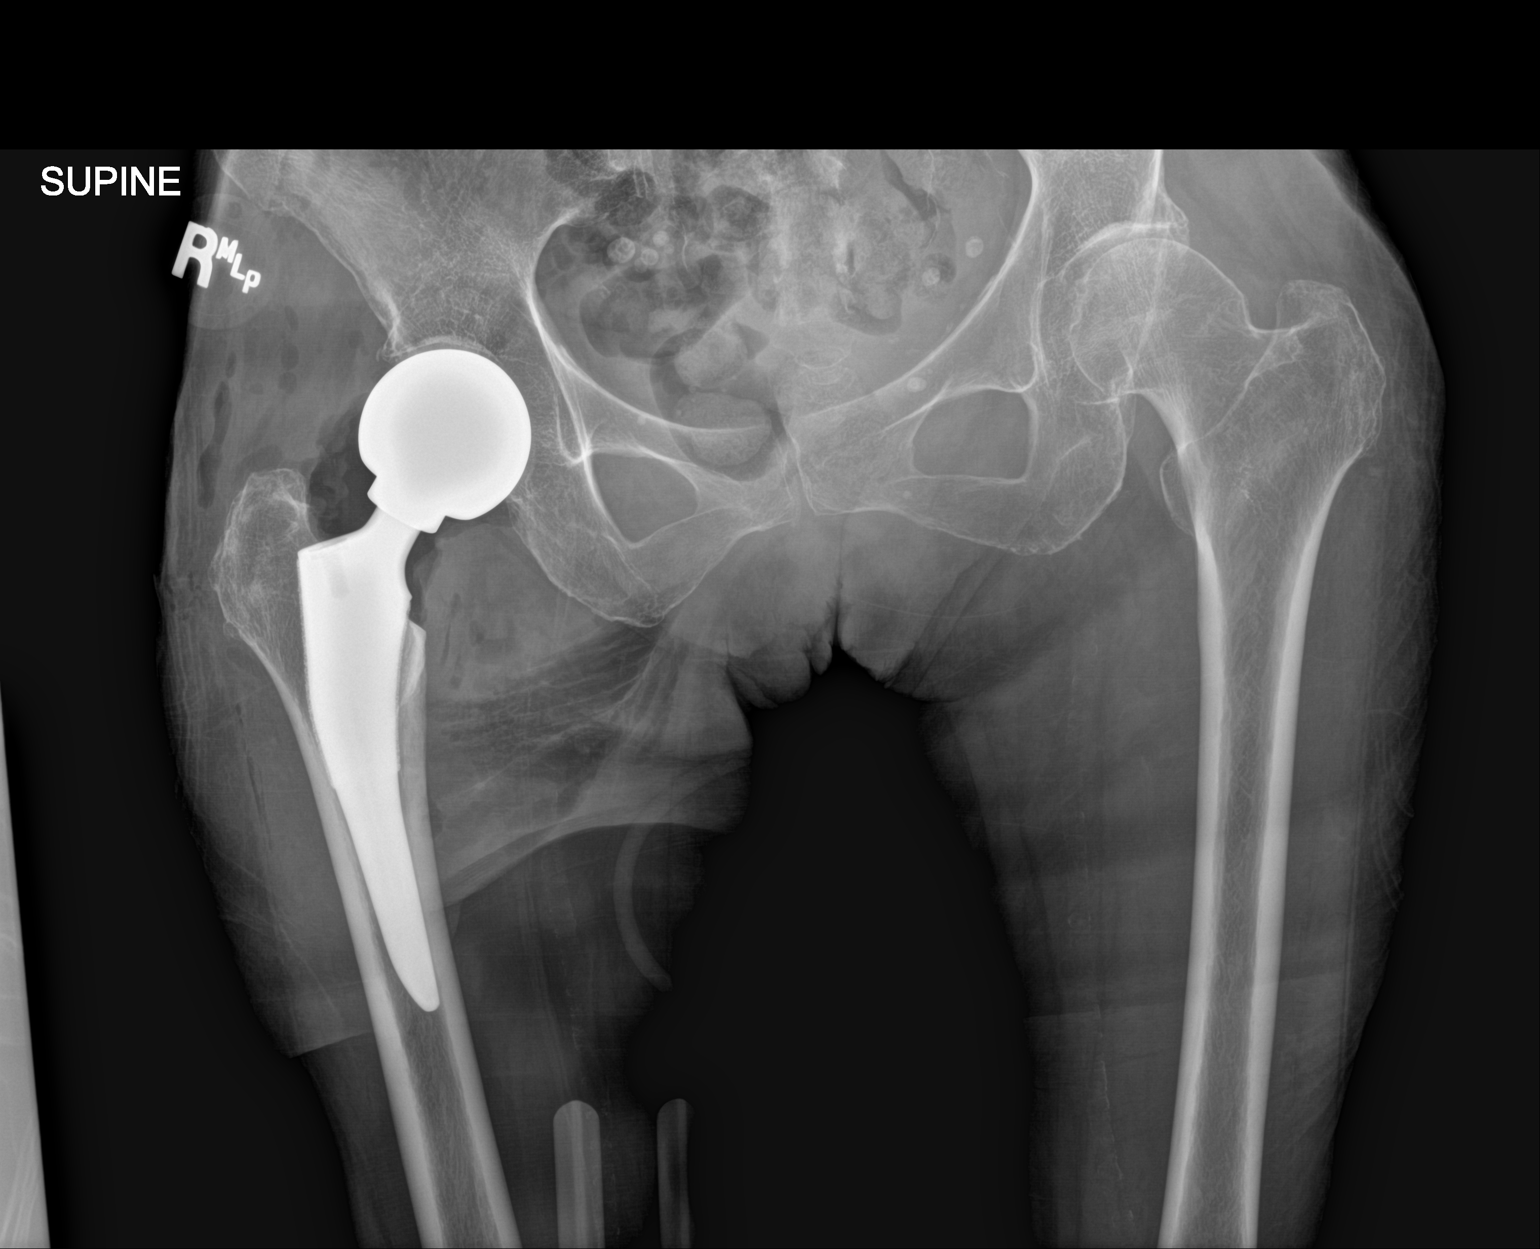

[2 of 2 positions shown; findings below may reference images not displayed]

FINDINGS: Interval placement of a unipolar right hip arthroplasty intact and
normally located. Diffuse osteopenia. Mild degenerate change of the
left hip.
IMPRESSION: Right hip arthroplasty intact and normally located.
